# Patient Record
Sex: Female | Born: 1990 | Race: Black or African American | Hispanic: No | Marital: Single | Smoking: Never smoker
Health system: Southern US, Community
[De-identification: ages and names within clinical notes are randomized; demographics above are authoritative.]

## PROBLEM LIST (undated history)

## (undated) DIAGNOSIS — N390 Urinary tract infection, site not specified: Secondary | ICD-10-CM

## (undated) DIAGNOSIS — E119 Type 2 diabetes mellitus without complications: Secondary | ICD-10-CM

## (undated) DIAGNOSIS — R87619 Unspecified abnormal cytological findings in specimens from cervix uteri: Secondary | ICD-10-CM

## (undated) DIAGNOSIS — IMO0002 Reserved for concepts with insufficient information to code with codable children: Secondary | ICD-10-CM

## (undated) DIAGNOSIS — I10 Essential (primary) hypertension: Secondary | ICD-10-CM

## (undated) DIAGNOSIS — Z331 Pregnant state, incidental: Secondary | ICD-10-CM

## (undated) HISTORY — PX: NO PAST SURGERIES: SHX2092

---

## 2011-02-16 ENCOUNTER — Other Ambulatory Visit (HOSPITAL_COMMUNITY): Payer: Self-pay | Admitting: Obstetrics and Gynecology

## 2011-02-16 DIAGNOSIS — O3680X0 Pregnancy with inconclusive fetal viability, not applicable or unspecified: Secondary | ICD-10-CM

## 2011-02-17 ENCOUNTER — Ambulatory Visit (HOSPITAL_COMMUNITY)
Admission: RE | Admit: 2011-02-17 | Discharge: 2011-02-17 | Disposition: A | Discharge status: Home / Self Care | Payer: Medicaid Other | Source: Ambulatory Visit | Attending: Obstetrics and Gynecology | Admitting: Obstetrics and Gynecology

## 2011-02-17 DIAGNOSIS — O3680X0 Pregnancy with inconclusive fetal viability, not applicable or unspecified: Secondary | ICD-10-CM

## 2011-02-17 DIAGNOSIS — Z3689 Encounter for other specified antenatal screening: Secondary | ICD-10-CM | POA: Insufficient documentation

## 2011-02-28 ENCOUNTER — Emergency Department (HOSPITAL_COMMUNITY)
Admission: EM | Admit: 2011-02-28 | Discharge: 2011-02-28 | Disposition: A | Discharge status: Home / Self Care | Payer: Medicaid Other | Attending: Emergency Medicine | Admitting: Emergency Medicine

## 2011-02-28 DIAGNOSIS — F410 Panic disorder [episodic paroxysmal anxiety] without agoraphobia: Secondary | ICD-10-CM | POA: Insufficient documentation

## 2011-02-28 DIAGNOSIS — O99891 Other specified diseases and conditions complicating pregnancy: Secondary | ICD-10-CM | POA: Insufficient documentation

## 2011-02-28 DIAGNOSIS — J45909 Unspecified asthma, uncomplicated: Secondary | ICD-10-CM | POA: Insufficient documentation

## 2011-03-11 LAB — RPR: RPR: NONREACTIVE

## 2011-03-11 LAB — HEPATITIS B SURFACE ANTIGEN: Hepatitis B Surface Ag: NEGATIVE

## 2011-03-11 LAB — RUBELLA ANTIBODY, IGM: Rubella: IMMUNE

## 2011-03-26 ENCOUNTER — Emergency Department (HOSPITAL_COMMUNITY)
Admission: EM | Admit: 2011-03-26 | Discharge: 2011-03-26 | Disposition: A | Discharge status: Home / Self Care | Payer: Medicaid Other | Attending: Emergency Medicine | Admitting: Emergency Medicine

## 2011-03-26 DIAGNOSIS — O99891 Other specified diseases and conditions complicating pregnancy: Secondary | ICD-10-CM | POA: Insufficient documentation

## 2011-03-26 DIAGNOSIS — Z79899 Other long term (current) drug therapy: Secondary | ICD-10-CM | POA: Insufficient documentation

## 2011-03-26 DIAGNOSIS — N949 Unspecified condition associated with female genital organs and menstrual cycle: Secondary | ICD-10-CM | POA: Insufficient documentation

## 2011-03-26 DIAGNOSIS — O209 Hemorrhage in early pregnancy, unspecified: Secondary | ICD-10-CM | POA: Insufficient documentation

## 2011-03-26 LAB — COMPREHENSIVE METABOLIC PANEL
AST: 21 U/L (ref 0–37)
Albumin: 4 g/dL (ref 3.5–5.2)
Alkaline Phosphatase: 81 U/L (ref 39–117)
Calcium: 9.9 mg/dL (ref 8.4–10.5)
Chloride: 101 mEq/L (ref 96–112)
Creatinine, Ser: 0.66 mg/dL (ref 0.50–1.10)
GFR calc Af Amer: >60 mL/min (ref >60)
GFR calc non Af Amer: >60 mL/min (ref >60)
Sodium: 136 mEq/L (ref 135–145)
Total Bilirubin: 0.2 mg/dL — ABNORMAL LOW (ref 0.3–1.2)

## 2011-03-26 LAB — DIFFERENTIAL
Basophils Absolute: 0 10*3/uL (ref 0.0–0.1)
Basophils Relative: 0 % (ref 0–1)
Eosinophils Relative: 3 % (ref 0–5)
Lymphocytes Relative: 20 % (ref 12–46)
Neutro Abs: 6.4 10*3/uL (ref 1.7–7.7)

## 2011-03-26 LAB — CBC
HCT: 36.3 % (ref 36.0–46.0)
MCHC: 34.7 g/dL (ref 30.0–36.0)
Platelets: 283 10*3/uL (ref 150–400)
RDW: 12.9 % (ref 11.5–15.5)
WBC: 9 10*3/uL (ref 4.0–10.5)

## 2011-03-26 LAB — ABO/RH: ABO/RH(D): O POS

## 2011-03-26 LAB — HCG, QUANTITATIVE, PREGNANCY: hCG, Beta Chain, Quant, S: 16807 m[IU]/mL — ABNORMAL HIGH (ref <5)

## 2011-04-23 ENCOUNTER — Encounter: Payer: Self-pay | Admitting: *Deleted

## 2011-04-23 ENCOUNTER — Emergency Department (HOSPITAL_COMMUNITY)
Admission: EM | Admit: 2011-04-23 | Discharge: 2011-04-23 | Disposition: A | Discharge status: Home / Self Care | Payer: Medicaid Other | Attending: Emergency Medicine | Admitting: Emergency Medicine

## 2011-04-23 DIAGNOSIS — O208 Other hemorrhage in early pregnancy: Secondary | ICD-10-CM | POA: Insufficient documentation

## 2011-04-23 DIAGNOSIS — O4690 Antepartum hemorrhage, unspecified, unspecified trimester: Secondary | ICD-10-CM

## 2011-04-23 NOTE — ED Notes (Signed)
MD at bedside. 

## 2011-04-23 NOTE — ED Notes (Addendum)
Pt states she is [redacted] weeks pregnant and with vag. Bleeding that started today; last office visit end of June and reported prenancy without problems, U/S scheduled for 8/9; states that this is her first pregnancy; sees doctors with [facility]; third time with vaginal bleeding per pt

## 2011-04-23 NOTE — ED Notes (Signed)
Went in pt's room, pt not in room, purse on bed

## 2011-04-23 NOTE — ED Notes (Signed)
C/o vaginal bleeding onset 2000 tonight; states blood in toilet x 1 then blood on tissue paper x 1; pt is not wearing a pad; c/o lower abd throbbing approx 2000; pt [redacted] wks pregnant
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
## 2011-05-07 ENCOUNTER — Other Ambulatory Visit (HOSPITAL_COMMUNITY): Payer: Self-pay | Admitting: Obstetrics and Gynecology

## 2011-05-07 DIAGNOSIS — Z3689 Encounter for other specified antenatal screening: Secondary | ICD-10-CM

## 2011-05-13 ENCOUNTER — Ambulatory Visit (HOSPITAL_COMMUNITY)
Admission: RE | Admit: 2011-05-13 | Discharge: 2011-05-13 | Disposition: A | Discharge status: Home / Self Care | Payer: Medicaid Other | Source: Ambulatory Visit | Attending: Obstetrics and Gynecology | Admitting: Obstetrics and Gynecology

## 2011-05-13 DIAGNOSIS — Z1389 Encounter for screening for other disorder: Secondary | ICD-10-CM | POA: Insufficient documentation

## 2011-05-13 DIAGNOSIS — O358XX0 Maternal care for other (suspected) fetal abnormality and damage, not applicable or unspecified: Secondary | ICD-10-CM | POA: Insufficient documentation

## 2011-05-13 DIAGNOSIS — Z363 Encounter for antenatal screening for malformations: Secondary | ICD-10-CM | POA: Insufficient documentation

## 2011-05-13 DIAGNOSIS — Z3689 Encounter for other specified antenatal screening: Secondary | ICD-10-CM

## 2011-05-16 ENCOUNTER — Inpatient Hospital Stay (HOSPITAL_COMMUNITY)
Admission: AD | Admit: 2011-05-16 | Discharge: 2011-05-16 | Disposition: A | Discharge status: Home / Self Care | Payer: Medicaid Other | Source: Ambulatory Visit | Attending: Obstetrics and Gynecology | Admitting: Obstetrics and Gynecology

## 2011-05-16 ENCOUNTER — Encounter (HOSPITAL_COMMUNITY): Payer: Self-pay | Admitting: *Deleted

## 2011-05-16 ENCOUNTER — Emergency Department (HOSPITAL_COMMUNITY)
Admission: EM | Admit: 2011-05-16 | Discharge: 2011-05-16 | Disposition: A | Discharge status: Other Acute Inpatient Hospital | Payer: Medicaid Other | Source: Home / Self Care | Attending: Emergency Medicine | Admitting: Emergency Medicine

## 2011-05-16 DIAGNOSIS — Z331 Pregnant state, incidental: Secondary | ICD-10-CM

## 2011-05-16 DIAGNOSIS — T07XXXA Unspecified multiple injuries, initial encounter: Secondary | ICD-10-CM | POA: Insufficient documentation

## 2011-05-16 DIAGNOSIS — O99891 Other specified diseases and conditions complicating pregnancy: Secondary | ICD-10-CM | POA: Insufficient documentation

## 2011-05-16 DIAGNOSIS — R109 Unspecified abdominal pain: Secondary | ICD-10-CM | POA: Insufficient documentation

## 2011-05-16 LAB — URINALYSIS, ROUTINE W REFLEX MICROSCOPIC
Bilirubin Urine: NEGATIVE
Hgb urine dipstick: NEGATIVE
Ketones, ur: 15 mg/dL — AB
Nitrite: NEGATIVE
Protein, ur: NEGATIVE mg/dL
Specific Gravity, Urine: >1.03 — ABNORMAL HIGH (ref 1.005–1.030)
Urobilinogen, UA: 0.2 mg/dL (ref 0.0–1.0)

## 2011-05-16 MED ORDER — OXYTOCIN 20 UNITS IN LACTATED RINGERS INFUSION - SIMPLE
125.0000 mL/h | INTRAVENOUS | Status: DC
Start: 2011-05-16 — End: 2011-05-16

## 2011-05-16 MED ORDER — LIDOCAINE HCL (PF) 1 % IJ SOLN: 30.0000 mL | INTRAMUSCULAR | Status: DC | PRN

## 2011-05-16 MED ORDER — NALBUPHINE SYRINGE 5 MG/0.5 ML: 5.0000 mg | INJECTION | INTRAMUSCULAR | Status: DC | PRN

## 2011-05-16 MED ORDER — ACETAMINOPHEN 325 MG PO TABS: 650.0000 mg | ORAL_TABLET | ORAL | Status: DC | PRN

## 2011-05-16 MED ORDER — OXYTOCIN BOLUS FROM INFUSION: 500.0000 mL | Freq: Once | INTRAVENOUS | Status: DC

## 2011-05-16 MED ORDER — SODIUM CHLORIDE 0.9 % IV SOLN: Freq: Once | INTRAVENOUS | Status: DC

## 2011-05-16 MED ORDER — LACTATED RINGERS IV SOLN: 500.0000 mL | INTRAVENOUS | Status: DC | PRN

## 2011-05-16 MED ORDER — CLINDAMYCIN PHOSPHATE 900 MG/50ML IV SOLN: 900.0000 mg | Freq: Three times a day (TID) | INTRAVENOUS | Status: DC

## 2011-05-16 MED ORDER — LACTATED RINGERS IV SOLN: INTRAVENOUS | Status: DC

## 2011-05-16 MED ORDER — IBUPROFEN 600 MG PO TABS: 600.0000 mg | ORAL_TABLET | Freq: Four times a day (QID) | ORAL | Status: DC | PRN

## 2011-05-16 MED ORDER — FLEET ENEMA 7-19 GM/118ML RE ENEM: 1.0000 | ENEMA | RECTAL | Status: DC | PRN

## 2011-05-16 MED ORDER — CITRIC ACID-SODIUM CITRATE 334-500 MG/5ML PO SOLN: 30.0000 mL | ORAL | Status: DC | PRN

## 2011-05-16 MED ORDER — OXYCODONE-ACETAMINOPHEN 5-325 MG PO TABS: 2.0000 | ORAL_TABLET | ORAL | Status: DC | PRN

## 2011-05-16 MED ORDER — ONDANSETRON HCL 4 MG/2ML IJ SOLN: 4.0000 mg | Freq: Four times a day (QID) | INTRAMUSCULAR | Status: DC | PRN

## 2011-05-16 NOTE — Discharge Instructions (Signed)
Assault, Physical Assault includes any behavior, whether intentional or reckless, which results in bodily injury to another person and/or damage to property. Included in this would be any behavior, intentional or reckless, that by its nature would be understood (interpreted) by a reasonable person as intent to harm another person or to damage his/her property. Threats may be oral or written. They may be communicated through regular mail, computer, fax, or phone. These threats may be direct or implied. FORMS OF ASSAULT INCLUDE  Physically assaulting a person. This includes physical threats to inflict physical harm as well as:   Slapping.  Hitting.   Poking.  Kicking.   Punching.  Pushing.    Arson.   Sabotage.   Equipment vandalism.   Damaging or destroying property.  Throwing or hitting objects.    Displaying a weapon or an object that appears to be a weapon in a threatening manner.   Carrying a firearm of any kind.   Using a weapon to harm someone.   Using greater physical size/strength to intimidate another.   Making intimidating or threatening gestures.   Bullying.   Hazing.   Intimidating, threatening, hostile, or abusive language directed toward another person.   It communicates the intention to engage in violence against that person. And it leads a reasonable person to expect that violent behavior may occur.   Stalking another person.  IF IT HAPPENS AGAIN  Should this occur again, immediately call for emergency help (911 in [place]   If someone poses clear and immediate danger to you, seek legal authorities to have a protective or restraining order put in place.   Less threatening assaults can at least be reported to authorities.  STEPS TO TAKE IF A SEXUAL ASSAULT HAS HAPPENED  Go to an area of safety. This may include a shelter or staying with a friend. Stay away from the area where you have been attacked. A large percentage of sexual assaults are caused by  a friend, relative or associate.   If medications were given by your caregiver, take them as directed for the full length of time prescribed.   Only take over-the-counter or prescription medicines for pain, discomfort, or fever as directed by your caregiver.   If you have come in contact with a sexual disease, find out if you are to be tested again. If your caregiver is concerned about the HIV/AIDS virus, he/she may require you to have continued testing for several months.   For the protection of your privacy, test results can not be given over the phone. Make sure you receive the results of your test. If your test results are not back during your visit, make an appointment with your caregiver to find out the results. Do not assume everything is normal if you have not heard from your caregiver or the medical facility. It is important for you to follow up on all of your test results.   File appropriate papers with authorities. This is important in all assaults, even if it has occurred in a family or by a friend.  SEEK MEDICAL CARE IF  You have new problems because of your injuries.   You have problems that may be because of the medicine you are taking, such as:   Rash.  Itching.   Swelling.  Trouble breathing.    You develop belly (abdominal) pain, feel sick to your stomach (nausea) or are vomiting.   You begin to run a temperature.   You need supportive care or referral  to a rape crisis center. These are centers with trained personnel who can help you get through this ordeal.  SEEK IMMEDIATE MEDICAL CARE IF  You are afraid of being threatened, beaten, or abused. In [place] call 911.   You receive new injuries related to abuse.   You develop severe pain in any area injured in the assault or have any change in your condition that concerns you.   You faint or lose consciousness.   You develop chest pain or shortness of breath.  Document Released: 09/14/2005 Document Re-Released:  06/23/2008 [facility] Patient Information 2011 [place], [place].

## 2011-05-16 NOTE — Progress Notes (Signed)
Pt just up to BR and voided. Blanket to pt when back to bed

## 2011-05-16 NOTE — ED Notes (Signed)
0630 NS 200 TC when arrived from [facility] infused. Fld bag and line d/ced and saline lock left intact. Site clean and dry
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
## 2011-05-16 NOTE — ED Notes (Signed)
Pt transported via [employer]

## 2011-05-16 NOTE — Progress Notes (Signed)
Called RN to request monitor to be adjusted due to FHR deceleration.
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
## 2011-05-16 NOTE — Progress Notes (Signed)
From 0612 to 0636 maternal FHR was recording. Pt had sat up in bed.Trans adj.
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
## 2011-05-16 NOTE — ED Notes (Signed)
IV intact R hand with NS running with 200cc TC.
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
## 2011-05-16 NOTE — ED Notes (Signed)
PB and crackers with soda to pt

## 2011-05-16 NOTE — ED Notes (Signed)
Pt remains on QS monitor, spoke to [place], Charity fundraiser at [facility] who reports she spoke to Dr Jackelyn Knife and he wants pt to be transferred to [facility] for further monitoring.
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
## 2011-05-16 NOTE — Progress Notes (Signed)
S. SHore CNM in to see pt. EFm reviewed
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
## 2011-05-18 NOTE — Progress Notes (Signed)
I have reviewed FHT from 8-18.  It is reassuring for 25 weeks, no significant ctx.

## 2011-06-10 ENCOUNTER — Emergency Department (HOSPITAL_COMMUNITY)
Admission: EM | Admit: 2011-06-10 | Discharge: 2011-06-11 | Disposition: A | Discharge status: Home / Self Care | Payer: Medicaid Other | Attending: Emergency Medicine | Admitting: Emergency Medicine

## 2011-06-10 ENCOUNTER — Other Ambulatory Visit: Payer: Self-pay

## 2011-06-10 ENCOUNTER — Encounter (HOSPITAL_COMMUNITY): Payer: Self-pay

## 2011-06-10 DIAGNOSIS — R55 Syncope and collapse: Secondary | ICD-10-CM | POA: Insufficient documentation

## 2011-06-10 DIAGNOSIS — I498 Other specified cardiac arrhythmias: Secondary | ICD-10-CM | POA: Insufficient documentation

## 2011-06-10 DIAGNOSIS — Z331 Pregnant state, incidental: Secondary | ICD-10-CM | POA: Insufficient documentation

## 2011-06-10 HISTORY — DX: Pregnant state, incidental: Z33.1

## 2011-06-10 LAB — URINALYSIS, ROUTINE W REFLEX MICROSCOPIC
Glucose, UA: NEGATIVE mg/dL
Hgb urine dipstick: NEGATIVE
Leukocytes, UA: NEGATIVE
Protein, ur: NEGATIVE mg/dL
Specific Gravity, Urine: 1.015 (ref 1.005–1.030)
pH: 6 (ref 5.0–8.0)

## 2011-06-10 LAB — DIFFERENTIAL
Basophils Absolute: 0 10*3/uL (ref 0.0–0.1)
Basophils Relative: 0 % (ref 0–1)
Eosinophils Absolute: 0.1 10*3/uL (ref 0.0–0.7)
Eosinophils Relative: 1 % (ref 0–5)
Lymphs Abs: 1.2 10*3/uL (ref 0.7–4.0)
Neutrophils Relative %: 83 % — ABNORMAL HIGH (ref 43–77)

## 2011-06-10 LAB — HEPATIC FUNCTION PANEL
AST: 15 U/L (ref 0–37)
Albumin: 3.1 g/dL — ABNORMAL LOW (ref 3.5–5.2)
Total Protein: 6.7 g/dL (ref 6.0–8.3)

## 2011-06-10 LAB — BASIC METABOLIC PANEL
Calcium: 9.6 mg/dL (ref 8.4–10.5)
GFR calc Af Amer: >60 mL/min (ref >60)
GFR calc non Af Amer: >60 mL/min (ref >60)
Glucose, Bld: 106 mg/dL — ABNORMAL HIGH (ref 70–99)
Potassium: 3.8 mEq/L (ref 3.5–5.1)
Sodium: 134 mEq/L — ABNORMAL LOW (ref 135–145)

## 2011-06-10 LAB — CBC
MCH: 27.8 pg (ref 26.0–34.0)
Platelets: 211 10*3/uL (ref 150–400)
RBC: 4.25 MIL/uL (ref 3.87–5.11)
RDW: 13 % (ref 11.5–15.5)
WBC: 10.3 10*3/uL (ref 4.0–10.5)

## 2011-06-10 MED ORDER — SODIUM CHLORIDE 0.9 % IV SOLN
Freq: Once | INTRAVENOUS | Status: AC
  Administered 2011-06-10: 22:00:00 via INTRAVENOUS
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
## 2011-06-10 NOTE — ED Notes (Signed)
Pt walked unassisted to bathroom, had steady gait, remains alert. resp even and unlabored, multiple family members at bedside.
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
## 2011-06-11 NOTE — ED Notes (Signed)
Pt left er stating no needs 

## 2011-07-22 ENCOUNTER — Encounter (HOSPITAL_COMMUNITY): Payer: Self-pay | Admitting: *Deleted

## 2011-07-22 ENCOUNTER — Emergency Department (HOSPITAL_COMMUNITY)
Admission: EM | Admit: 2011-07-22 | Discharge: 2011-07-22 | Disposition: A | Discharge status: Home / Self Care | Payer: Medicaid Other | Attending: Emergency Medicine | Admitting: Emergency Medicine

## 2011-07-22 DIAGNOSIS — R51 Headache: Secondary | ICD-10-CM

## 2011-07-22 DIAGNOSIS — Z331 Pregnant state, incidental: Secondary | ICD-10-CM | POA: Insufficient documentation

## 2011-07-22 DIAGNOSIS — J45909 Unspecified asthma, uncomplicated: Secondary | ICD-10-CM | POA: Insufficient documentation

## 2011-07-22 MED ORDER — MORPHINE SULFATE 4 MG/ML IJ SOLN
4.0000 mg | Freq: Once | INTRAMUSCULAR | Status: AC
  Administered 2011-07-22: 4 mg via INTRAMUSCULAR
  Filled 2011-07-22: qty 1

## 2011-07-22 NOTE — ED Provider Notes (Signed)
History     CSN: [id number] Arrival date & time: 07/22/2011  2:05 AM   First MD Initiated Contact with Patient 07/22/11 0235      Chief Complaint  Patient presents with  . Headache    (Consider location/radiation/quality/duration/timing/severity/associated sxs/prior treatment) HPI Comments: Patient is a 20 year old female who is approximately [redacted] weeks pregnant who presents with a complaint of headache. She states that this started approximately 2 and half hours prior to arrival, is a throbbing sensation, feels this pain across the front of her head and radiation to her neck. Her symptoms are constant, not made better with Tylenol and not associated with nausea vomiting diarrhea cough shortness of breath or abdominal pain. She does have some intermittent blurry vision with this that she noted when she was driving here tonight. She denies swelling of her feet. She admits to getting approximately 2 headaches per week but states that this headache is different in the way that it is located in the frontal area and because it has not gone away with Tylenol.  Patient is a 20 y.o. female presenting with headaches. The history is provided by the patient, a friend and medical records.  Headache     Past Medical History  Diagnosis Date  . Asthma   . Pregnant state, incidental     Past Surgical History  Procedure Date  . No past surgeries     No family history on file.  History  Substance Use Topics  . Smoking status: Never Smoker   . Smokeless tobacco: Not on file  . Alcohol Use: No    OB History    Grav Para Term Preterm Abortions TAB SAB Ect Mult Living   1               Review of Systems  Neurological: Positive for headaches.  All other systems reviewed and are negative.    Allergies  Review of patient's allergies indicates no known allergies.  Home Medications   Current Outpatient Rx  Name Route Sig Dispense Refill  . PRENATAL RX 60-1 MG PO TABS Oral Take 1  tablet by mouth daily.      Marland Kitchen DOCUSATE SODIUM 50 MG PO CAPS Oral Take by mouth 2 (two) times daily.      Marland Kitchen FEXOFENADINE HCL 180 MG PO TABS Oral Take 180 mg by mouth daily.        BP 162/93  Pulse 92  Temp(Src) 98.6 F (37 C) (Oral)  Resp 20  Ht 5\' 5"  (1.651 m)  Wt 270 lb (122.471 kg)  BMI 44.93 kg/m2  SpO2 97%  Physical Exam  Nursing note and vitals reviewed. Constitutional: She appears well-developed and well-nourished. No distress.  HENT:  Head: Normocephalic and atraumatic.  Mouth/Throat: Oropharynx is clear and moist. No oropharyngeal exudate.  Eyes: Conjunctivae and EOM are normal. Pupils are equal, round, and reactive to light. Right eye exhibits no discharge. Left eye exhibits no discharge. No scleral icterus.  Neck: Normal range of motion. Neck supple. No JVD present. No thyromegaly present.  Cardiovascular: Normal rate, regular rhythm, normal heart sounds and intact distal pulses.  Exam reveals no gallop and no friction rub.   No murmur heard. Pulmonary/Chest: Effort normal and breath sounds normal. No respiratory distress. She has no wheezes. She has no rales.  Abdominal: Soft. Bowel sounds are normal. She exhibits no distension and no mass. There is no tenderness.  Musculoskeletal: Normal range of motion. She exhibits no edema and no tenderness.  Lymphadenopathy:  She has no cervical adenopathy.  Neurological: She is alert. Coordination normal.  Skin: Skin is warm and dry. No rash noted. No erythema.  Psychiatric: She has a normal mood and affect. Her behavior is normal.    ED Course  Procedures (including critical care time)  Labs Reviewed - No data to display No results found.   No diagnosis found.    MDM  Physical exam is benign with soft but a gravid abdomen, clear lungs and heart, clear oropharynx, normal pupillary exam. She is hypertensive at 162/93. Will need visual acuity, pain medications, reevaluate blood pressure.    Visual acuity was 20/20,  patient has complete resolution of symptoms with pain medication including IV morphine. Recheck of her blood pressure is 112/68.  I have recommended that she followup with her OB/GYN this week for a recheck in her blood pressure.  Vida Roller, MD 07/22/11 [phone]

## 2011-07-22 NOTE — ED Notes (Signed)
Repeat B/P - 112/58 L arm. Dr Hyacinth Meeker at bedside.

## 2011-07-22 NOTE — ED Notes (Signed)
Pt reports "migraine" type headache starting approx 1 hr ago, pt reports she is [redacted] weeks pregnant, no abd pain or bleeding noted

## 2011-07-23 ENCOUNTER — Other Ambulatory Visit: Payer: Self-pay

## 2011-07-23 ENCOUNTER — Encounter (HOSPITAL_COMMUNITY): Payer: Self-pay | Admitting: *Deleted

## 2011-07-23 ENCOUNTER — Emergency Department (HOSPITAL_COMMUNITY): Payer: Medicaid Other

## 2011-07-23 ENCOUNTER — Emergency Department (HOSPITAL_COMMUNITY)
Admission: EM | Admit: 2011-07-23 | Discharge: 2011-07-23 | Disposition: A | Discharge status: Home / Self Care | Payer: Medicaid Other | Attending: Emergency Medicine | Admitting: Emergency Medicine

## 2011-07-23 DIAGNOSIS — R110 Nausea: Secondary | ICD-10-CM | POA: Insufficient documentation

## 2011-07-23 DIAGNOSIS — I951 Orthostatic hypotension: Secondary | ICD-10-CM

## 2011-07-23 DIAGNOSIS — R55 Syncope and collapse: Secondary | ICD-10-CM

## 2011-07-23 DIAGNOSIS — O99891 Other specified diseases and conditions complicating pregnancy: Secondary | ICD-10-CM | POA: Insufficient documentation

## 2011-07-23 LAB — DIFFERENTIAL
Basophils Absolute: 0 10*3/uL (ref 0.0–0.1)
Eosinophils Relative: 2 % (ref 0–5)
Lymphocytes Relative: 21 % (ref 12–46)
Lymphs Abs: 1.6 10*3/uL (ref 0.7–4.0)
Monocytes Absolute: 0.4 10*3/uL (ref 0.1–1.0)
Neutro Abs: 5.3 10*3/uL (ref 1.7–7.7)

## 2011-07-23 LAB — URINALYSIS, ROUTINE W REFLEX MICROSCOPIC
Bilirubin Urine: NEGATIVE
Hgb urine dipstick: NEGATIVE
Nitrite: NEGATIVE
Protein, ur: NEGATIVE mg/dL
Urobilinogen, UA: 0.2 mg/dL (ref 0.0–1.0)

## 2011-07-23 LAB — COMPREHENSIVE METABOLIC PANEL
ALT: 13 U/L (ref 0–35)
AST: 17 U/L (ref 0–37)
CO2: 22 mEq/L (ref 19–32)
Chloride: 104 mEq/L (ref 96–112)
Creatinine, Ser: 0.59 mg/dL (ref 0.50–1.10)
GFR calc Af Amer: >90 mL/min (ref >90)
GFR calc non Af Amer: >90 mL/min (ref >90)
Glucose, Bld: 96 mg/dL (ref 70–99)
Sodium: 135 mEq/L (ref 135–145)
Total Bilirubin: 0.1 mg/dL — ABNORMAL LOW (ref 0.3–1.2)

## 2011-07-23 LAB — CBC
HCT: 37.2 % (ref 36.0–46.0)
MCV: 80.9 fL (ref 78.0–100.0)
RBC: 4.6 MIL/uL (ref 3.87–5.11)
RDW: 13.6 % (ref 11.5–15.5)
WBC: 7.4 10*3/uL (ref 4.0–10.5)

## 2011-07-23 MED ORDER — OXYCODONE-ACETAMINOPHEN 5-325 MG PO TABS
ORAL_TABLET | ORAL | Status: AC
  Filled 2011-07-23: qty 1

## 2011-07-23 MED ORDER — ACETAMINOPHEN-CODEINE #3 300-30 MG PO TABS: 1.0000 | ORAL_TABLET | Freq: Four times a day (QID) | ORAL | Status: AC | PRN

## 2011-07-23 MED ORDER — SODIUM CHLORIDE 0.9 % IV BOLUS (SEPSIS)
1000.0000 mL | Freq: Once | INTRAVENOUS | Status: AC
  Administered 2011-07-23: 1000 mL via INTRAVENOUS

## 2011-07-23 MED ORDER — OXYCODONE-ACETAMINOPHEN 5-325 MG PO TABS
1.0000 | ORAL_TABLET | Freq: Once | ORAL | Status: AC
  Administered 2011-07-23: 1 via ORAL
  Filled 2011-07-23: qty 1

## 2011-07-23 NOTE — ED Notes (Signed)
Pt reports she woke up about 3:30, and when she got up she became dizzy and fell.

## 2011-07-23 NOTE — ED Notes (Signed)
Pt reports getting dizzy and "thinking I passed out", after getting up.  Pt reports falling on right side, and currently feeling some ache in right side.  Denies any abdominal pain, fluid, discharge or bleeding.  FHT 153, strong and regular.  Pt denies any dizziness at present time.  Does report mild nausea.  IV started, EKG completed.  Dr. Hyacinth Meeker at bedside.

## 2011-07-23 NOTE — ED Provider Notes (Signed)
History     CSN: [id number] Arrival date & time: 07/23/2011  4:21 AM   First MD Initiated Contact with Patient 07/23/11 [phone]      Chief Complaint  Patient presents with  . Near Syncope  . Nausea    (Consider location/radiation/quality/duration/timing/severity/associated sxs/prior treatment) HPI Comments: Vision is a 20 year old female with a history of pregnancy at [redacted] weeks gestation who presents with a syncopal episode. The patient was seen yesterday in the emergency department with a headache and was treated with morphine with significant improvement. She states that throughout the day she has been groggy and tired after getting the morphine but has been able to eat at least to meals and has been ambulatory. She states that she went to bed at 10:30 and awoke at 3:30 in the morning, ambulated to the kitchen to get a meal, back to the bedroom to eat, and then back to the kitchen. On her way back to the kitchen she remembers feeling lightheaded as she stood up and walked after which she had a syncopal episode which she does not remember. The next thing she does remember she was awake on the floor of the kitchen, and because she was essentially by herself she got herself into the car and drove herself to the hospital. She had no difficulty ambulating to the car were driving to the hospital. She admits that she still has a mild headache but has no changes in her vision, numbness, weakness, ataxia, speech problems. She denies chest pain, palpitations, shortness of breath, abdominal pain, diarrhea, dysuria, swelling or rashes. She states that she is followed by OB/GYN for her pregnancy and has no complications with this pregnancy. According to the medical record she was seen approximately one month ago for similar symptoms after having a syncopal event.  The history is provided by the patient and medical records.    Past Medical History  Diagnosis Date  . Asthma   . Pregnant state, incidental      Past Surgical History  Procedure Date  . No past surgeries     History reviewed. No pertinent family history.  History  Substance Use Topics  . Smoking status: Never Smoker   . Smokeless tobacco: Not on file  . Alcohol Use: No    OB History    Grav Para Term Preterm Abortions TAB SAB Ect Mult Living   1               Review of Systems  All other systems reviewed and are negative.    Allergies  Review of patient's allergies indicates no known allergies.  Home Medications   Current Outpatient Rx  Name Route Sig Dispense Refill  . ACETAMINOPHEN-CODEINE #3 300-30 MG PO TABS Oral Take 1-2 tablets by mouth every 6 (six) hours as needed for pain. 15 tablet 0  . DOCUSATE SODIUM 50 MG PO CAPS Oral Take by mouth 2 (two) times daily.      Marland Kitchen FEXOFENADINE HCL 180 MG PO TABS Oral Take 180 mg by mouth daily.      Marland Kitchen PRENATAL RX 60-1 MG PO TABS Oral Take 1 tablet by mouth daily.        BP 118/75  Pulse 88  Temp 98.3 F (36.8 C)  Resp 18  SpO2 97%  Physical Exam  Nursing note and vitals reviewed. Constitutional: She appears well-developed and well-nourished. No distress.  HENT:  Head: Normocephalic and atraumatic.  Mouth/Throat: Oropharynx is clear and moist. No oropharyngeal exudate.  Eyes:  Conjunctivae and EOM are normal. Pupils are equal, round, and reactive to light. Right eye exhibits no discharge. Left eye exhibits no discharge. No scleral icterus.  Neck: Normal range of motion. Neck supple. No JVD present. No thyromegaly present.  Cardiovascular: Normal rate, regular rhythm, normal heart sounds and intact distal pulses.  Exam reveals no gallop and no friction rub.   No murmur heard. Pulmonary/Chest: Effort normal and breath sounds normal. No respiratory distress. She has no wheezes. She has no rales.  Abdominal: Soft. Bowel sounds are normal. She exhibits no distension and no mass. There is no tenderness.       Gravid and nontender  Musculoskeletal: Normal range  of motion. She exhibits no edema and no tenderness.  Lymphadenopathy:    She has no cervical adenopathy.  Neurological: She is alert. Coordination normal.  Skin: Skin is warm and dry. No rash noted. No erythema.  Psychiatric: She has a normal mood and affect. Her behavior is normal.    ED Course  Procedures (including critical care time)  Diagnosis:  1. Syncope   2. Orthostatic hypotension       MDM  At this time the patient has a very normal exam including neurologic exam. She has normal vital signs, normal speech, moving all extremities, normal heart and lung exam, this gravid abdomen with normal fetal heart tones at 153. She has no apparent trauma from her fall including no head injuries or upper or lower extremity injuries or deformity. We'll check hemoglobin, urinalysis, electrolytes. Patient may need CT scan of her head to rule out other sources of headache precipitating syncope.  ED ECG REPORT   Date: 07/23/2011   Rate: 97  Rhythm: normal sinus rhythm  QRS Axis: normal  Intervals: normal  ST/T Wave abnormalities: nonspecific T wave changes  Conduction Disutrbances:none  Narrative Interpretation: Has no change from prior 06/10/11 - T waves still inverted inferior and anterior leads  Old EKG Reviewed: unchanged  Results for orders placed during the hospital encounter of 07/23/11  CBC      Component Value Range   WBC 7.4  4.0 - 10.5 (K/uL)   RBC 4.60  3.87 - 5.11 (MIL/uL)   Hemoglobin 12.7  12.0 - 15.0 (g/dL)   HCT 40.9  81.1 - 91.4 (%)   MCV 80.9  78.0 - 100.0 (fL)   MCH 27.6  26.0 - 34.0 (pg)   MCHC 34.1  30.0 - 36.0 (g/dL)   RDW 78.2  95.6 - 21.3 (%)   Platelets 171  150 - 400 (K/uL)  DIFFERENTIAL      Component Value Range   Neutrophils Relative 72  43 - 77 (%)   Neutro Abs 5.3  1.7 - 7.7 (K/uL)   Lymphocytes Relative 21  12 - 46 (%)   Lymphs Abs 1.6  0.7 - 4.0 (K/uL)   Monocytes Relative 5  3 - 12 (%)   Monocytes Absolute 0.4  0.1 - 1.0 (K/uL)    Eosinophils Relative 2  0 - 5 (%)   Eosinophils Absolute 0.1  0.0 - 0.7 (K/uL)   Basophils Relative 0  0 - 1 (%)   Basophils Absolute 0.0  0.0 - 0.1 (K/uL)  COMPREHENSIVE METABOLIC PANEL      Component Value Range   Sodium 135  135 - 145 (mEq/L)   Potassium 3.6  3.5 - 5.1 (mEq/L)   Chloride 104  96 - 112 (mEq/L)   CO2 22  19 - 32 (mEq/L)   Glucose, Bld  96  70 - 99 (mg/dL)   BUN 6  6 - 23 (mg/dL)   Creatinine, Ser 9.62  0.50 - 1.10 (mg/dL)   Calcium 95.2  8.4 - 10.5 (mg/dL)   Total Protein 6.7  6.0 - 8.3 (g/dL)   Albumin 2.9 (*) 3.5 - 5.2 (g/dL)   AST 17  0 - 37 (U/L)   ALT 13  0 - 35 (U/L)   Alkaline Phosphatase 173 (*) 39 - 117 (U/L)   Total Bilirubin 0.1 (*) 0.3 - 1.2 (mg/dL)   GFR calc non Af Amer >90  >90 (mL/min)   GFR calc Af Amer >90  >90 (mL/min)  URINALYSIS, ROUTINE W REFLEX MICROSCOPIC      Component Value Range   Color, Urine YELLOW  YELLOW    Appearance CLEAR  CLEAR    Specific Gravity, Urine 1.020  1.005 - 1.030    pH 6.5  5.0 - 8.0    Glucose, UA NEGATIVE  NEGATIVE (mg/dL)   Hgb urine dipstick NEGATIVE  NEGATIVE    Bilirubin Urine NEGATIVE  NEGATIVE    Ketones, ur NEGATIVE  NEGATIVE (mg/dL)   Protein, ur NEGATIVE  NEGATIVE (mg/dL)   Urobilinogen, UA 0.2  0.0 - 1.0 (mg/dL)   Nitrite NEGATIVE  NEGATIVE    Leukocytes, UA NEGATIVE  NEGATIVE    Ct Head Wo Contrast  07/23/2011  *RADIOLOGY REPORT*  Clinical Data: Near-syncope; nausea.  [redacted] weeks pregnant.  CT HEAD WITHOUT CONTRAST  Technique:  Contiguous axial images were obtained from the base of the skull through the vertex without contrast.  Comparison: None.  Findings: There is no evidence of acute infarction, mass lesion, or intra- or extra-axial hemorrhage on CT.  The posterior fossa, including the cerebellum, brainstem and fourth ventricle, is within normal limits.  The third and lateral ventricles, and basal ganglia are unremarkable in appearance.  The cerebral hemispheres are symmetric in appearance, with normal  gray- white differentiation.  No mass effect or midline shift is seen.  There is no evidence of fracture; visualized osseous structures are unremarkable in appearance.  The visualized portions of the orbits are within normal limits.  The paranasal sinuses and mastoid air cells are well-aerated.  No significant soft tissue abnormalities are seen.  IMPRESSION: Unremarkable noncontrast CT of the head.  Original Report Authenticated By: Tonia Ghent, M.D.   Findings of the CT scan in the blood and urine tests reviewed with patient. She will followup today at her appointment. I suspect that she had orthostatic hypotension given the change in her vital signs when she stands. She was given IV fluid resuscitation on arrival.   Vida Roller, MD 07/23/11 [phone]

## 2011-08-09 ENCOUNTER — Inpatient Hospital Stay (HOSPITAL_COMMUNITY)
Admission: AD | Admit: 2011-08-09 | Discharge: 2011-08-09 | Disposition: A | Discharge status: Home / Self Care | Payer: Medicaid Other | Attending: Obstetrics and Gynecology | Admitting: Obstetrics and Gynecology

## 2011-08-09 ENCOUNTER — Encounter (HOSPITAL_COMMUNITY): Payer: Self-pay | Admitting: Emergency Medicine

## 2011-08-09 DIAGNOSIS — R109 Unspecified abdominal pain: Secondary | ICD-10-CM

## 2011-08-09 DIAGNOSIS — O479 False labor, unspecified: Secondary | ICD-10-CM | POA: Insufficient documentation

## 2011-08-09 DIAGNOSIS — O26899 Other specified pregnancy related conditions, unspecified trimester: Secondary | ICD-10-CM

## 2011-08-09 NOTE — Progress Notes (Signed)
Saline lock dc'd as pt being discharged home.

## 2011-08-09 NOTE — ED Notes (Signed)
Pt place don TOCO monitor upon arrival. Pt is laughing and talking with S.O. NAD. PT states last pain to lower back was at 1740.

## 2011-08-09 NOTE — Progress Notes (Signed)
Dr. Senaida Ores notified of pt presenting from [facility] for labor check there.  MD was aware of pt coming via carelink.  Notified of VE and reactive fetal strip. Notified of pt having appointment tomorrow.  Orders to dc home.

## 2011-08-09 NOTE — ED Notes (Signed)
K.Mecca Guitron,RNC-OB/RROB spokw with Dr Lynelle Doctor and told of pt with no contractions, fhr reactive, offered to send RROB over who is coming on shift to check pt's cervix and assess pt to avoid pt having to be transferred to another facility if not laboring.  Dr did not wish to do this, would rather pt to be transferred to [facility] to be assessed.

## 2011-08-09 NOTE — ED Notes (Addendum)
Pt [redacted] weeks pregnant. C/o abd cramping and back pain x 1.5 hours. Denies any vaginal bleeding or d/c.

## 2011-08-09 NOTE — ED Provider Notes (Signed)
History     CSN: [id number] Arrival date & time: 08/09/2011  5:56 PM   First MD Initiated Contact with Patient 08/09/11 1756      Chief Complaint  Patient presents with  . Back Pain  . Labor Eval    (Consider location/radiation/quality/duration/timing/severity/associated sxs/prior treatment) HPI Patient presents to the emergency room with complaints of contractions. She is [redacted] weeks pregnant and has abdominal and back pain for the last 1.5 hours. She denies any vaginal bleeding or discharge. Patient thinks the contractions are coming every 5-7 minutes. Patient is G1 P0. The contractions are moderate. She has no other complaints. Past Medical History  Diagnosis Date  . Asthma   . Pregnant state, incidental     Past Surgical History  Procedure Date  . No past surgeries     History reviewed. No pertinent family history.  History  Substance Use Topics  . Smoking status: Never Smoker   . Smokeless tobacco: Not on file  . Alcohol Use: No    OB History    Grav Para Term Preterm Abortions TAB SAB Ect Mult Living   1               Review of Systems  All other systems reviewed and are negative.    Allergies  Review of patient's allergies indicates no known allergies.  Home Medications   Current Outpatient Rx  Name Route Sig Dispense Refill  . DOCUSATE SODIUM 50 MG PO CAPS Oral Take by mouth 2 (two) times daily.      Marland Kitchen FEXOFENADINE HCL 180 MG PO TABS Oral Take 180 mg by mouth daily.      Marland Kitchen PRENATAL RX 60-1 MG PO TABS Oral Take 1 tablet by mouth daily.        BP 133/96  Pulse 101  Temp 98.5 F (36.9 C)  Resp 20  Ht 5\' 5"  (1.651 m)  Wt 269 lb (122.018 kg)  BMI 44.76 kg/m2  Physical Exam  Nursing note and vitals reviewed. Constitutional: She appears well-developed and well-nourished. No distress.  HENT:  Head: Normocephalic and atraumatic.  Right Ear: External ear normal.  Left Ear: External ear normal.  Eyes: Conjunctivae are normal. Right eye exhibits  no discharge. Left eye exhibits no discharge. No scleral icterus.  Neck: Neck supple. No tracheal deviation present.  Cardiovascular: Normal rate, regular rhythm and intact distal pulses.   Pulmonary/Chest: Effort normal and breath sounds normal. No stridor. No respiratory distress. She has no wheezes. She has no rales.  Abdominal: Soft. Bowel sounds are normal. She exhibits mass. She exhibits no distension. There is no tenderness. There is no rebound and no guarding.       Gravid abdomen, nontender  Genitourinary:       Cervix approximately 1 cm dilated soft, occiput presentation,   Musculoskeletal: She exhibits no edema and no tenderness.  Neurological: She is alert. She has normal strength. No sensory deficit. Cranial nerve deficit:  no gross defecits noted. She exhibits normal muscle tone. She displays no seizure activity. Coordination normal.  Skin: Skin is warm and dry. No rash noted.  Psychiatric: She has a normal mood and affect.    ED Course  Procedures (including critical care time) Patient was placed on fetal heart monitor. [facility] was contacted to arrange for transport. I consulted with Dr. Senaida Ores. The patient be transferred to the MAU for evaluation.  Medications - No data to display  Labs Reviewed - No data to display No results found.  MDM  Patient presents in late third trimester with complaints of contractions. At this time she is hemodynamically stable. She does not appear to be in immediate labor. She'll be transferred to [facility] hospital for further evaluation        Celene Kras, MD 08/09/11 1843

## 2011-08-09 NOTE — Progress Notes (Signed)
Pt arrived via carelink from [facility].  Presented there for back labor.  Pt stated that after being there her contractions "stopped at 520". Pt denies pain at this time.

## 2011-08-18 ENCOUNTER — Inpatient Hospital Stay (EMERGENCY_DEPARTMENT_HOSPITAL)
Admission: EM | Admit: 2011-08-18 | Discharge: 2011-08-18 | Disposition: A | Discharge status: Home / Self Care | Payer: Medicaid Other | Source: Ambulatory Visit | Admitting: Obstetrics and Gynecology

## 2011-08-18 ENCOUNTER — Encounter (HOSPITAL_COMMUNITY): Payer: Self-pay

## 2011-08-18 DIAGNOSIS — O479 False labor, unspecified: Secondary | ICD-10-CM | POA: Insufficient documentation

## 2011-08-18 HISTORY — DX: Reserved for concepts with insufficient information to code with codable children: IMO0002

## 2011-08-18 HISTORY — DX: Unspecified abnormal cytological findings in specimens from cervix uteri: R87.619

## 2011-08-18 HISTORY — DX: Urinary tract infection, site not specified: N39.0

## 2011-08-18 NOTE — ED Provider Notes (Signed)
History     CSN: [id number] Arrival date & time: 08/18/2011  2:11 PM   First MD Initiated Contact with Patient 08/18/11 1419      Chief Complaint  Patient presents with  . Contractions    (Consider location/radiation/quality/duration/timing/severity/associated sxs/prior treatment) The history is provided by the patient.  She is 38.[redacted] weeks pregnant with EDC of November 29. One hour ago, in the shower, she noted onset of contractions and when she was drying herself after the shower, she noted some blood on the towel. Contractions have been coming about every 7-10 minutes. She is G1, P0, A0. She denies any complications of the current pregnancy other than episodes of syncope. She has had some slight peripheral edema.  Past Medical History  Diagnosis Date  . Asthma   . Pregnant state, incidental     Past Surgical History  Procedure Date  . No past surgeries     No family history on file.  History  Substance Use Topics  . Smoking status: Never Smoker   . Smokeless tobacco: Not on file  . Alcohol Use: No    OB History    Grav Para Term Preterm Abortions TAB SAB Ect Mult Living   1               Review of Systems  All other systems reviewed and are negative.    Allergies  Review of patient's allergies indicates no known allergies.  Home Medications   Current Outpatient Rx  Name Route Sig Dispense Refill  . DOCUSATE SODIUM 50 MG PO CAPS Oral Take by mouth 2 (two) times daily.      Marland Kitchen FEXOFENADINE HCL 180 MG PO TABS Oral Take 180 mg by mouth daily.      Marland Kitchen PRENATAL RX 60-1 MG PO TABS Oral Take 1 tablet by mouth daily.        BP 128/86  Pulse 101  Temp(Src) 98.2 F (36.8 C) (Oral)  Resp 20  Ht 5\' 5"  (1.651 m)  Wt 259 lb (117.482 kg)  BMI 43.10 kg/m2  SpO2 98%  Physical Exam  Nursing note and vitals reviewed.  20 year old female in no acute distress. Vital signs are normal. She is morbidly obese. Head is normocephalic and atraumatic. PERRLA, EOMI. Neck is  supple without adenopathy. Back is nontender. Lungs are clear without rales, wheezes, rhonchi. Heart has regular rate and rhythm with a faint 1-2/6 systolic ejection murmur present. Abdomen is soft and nontender. Fundus is gravid and almost to the xiphoid. Manual pelvic exam shows cervix is closed and perhaps 25% effaced. No blood is seen. Extremities have 1+ pitting edema. Neurologic: Mental status is normal, cranial nerves are intact, there are no focal motor or sensory deficits. Psychiatric: No abnormalities of mood or affect.  ED Course  Procedures (including critical care time)  Labs Reviewed - No data to display No results found.   No diagnosis found.  Case has been discussed with Dr. Tracey Harries who agrees to accept the patient in transfer. She will be sent to [facility] in [place] for fetal monitoring and observation.  MDM  Prior ED visit reviewed. She was in the ED 1 week ago with contractions and was transferred to [facility] hospital for observation.        Dione Booze, MD 08/18/11 [phone]

## 2011-08-18 NOTE — ED Notes (Signed)
Dr. Preston Fleeting reports cervix is not open at this time.

## 2011-08-18 NOTE — ED Notes (Signed)
Dr. Preston Fleeting checking pt's cervix

## 2011-08-18 NOTE — ED Notes (Signed)
Placed pt on fetal monitor.  Notified Counselling psychologist at [employer] and she is monitoring pt.

## 2011-08-18 NOTE — ED Notes (Signed)
Pt reports was in shower this afternoon around 1300 and passed some bloody, mucous.  Reports shortly after started having contractions.  Says contractions have been 7-9min apart.

## 2011-08-18 NOTE — ED Provider Notes (Signed)
Chief Complaint:  Contractions   Marie Price is  20 y.o. G1P0.  No LMP recorded. Patient is pregnant.. [redacted]w[redacted]d  She presents complaining of Contractions  Onset is described as sudden and has been present for  Several  hours. Pt was transferred from [facility] ED for further evaluation of c/o bleeding and ctx. Pt reports sudden onset of BRB this morning while in the shower, shortly after ctx began q 7-10 mins. Pt was evaluated in ED at [facility] and had negative exam. Presents now as transfer reports irregular ctx ~ q 10 mins. Report good FM. Denies vaginal bleeding at present.  Obstetrical/Gynecological History: OB History    Grav Para Term Preterm Abortions TAB SAB Ect Mult Living   1               Past Medical History: Past Medical History  Diagnosis Date  . Asthma   . Pregnant state, incidental   . Urinary tract infection   . Abnormal Pap smear     Past Surgical History: Past Surgical History  Procedure Date  . No past surgeries     Family History: Family History  Problem Relation Age of Onset  . Anesthesia problems Neg Hx     Social History: History  Substance Use Topics  . Smoking status: Never Smoker   . Smokeless tobacco: Never Used  . Alcohol Use: No    Allergies: No Known Allergies  Review of Systems - Negative except what has been reviewed in HPI  Physical Exam   Blood pressure 147/96, pulse 99, temperature 98.3 F (36.8 C), temperature source Oral, resp. rate 20, height 5\' 5"  (1.651 m), weight 117.482 kg (259 lb), SpO2 99.00%.  General: General appearance - alert, well appearing, and in no distress, oriented to person, place, and time and overweight Mental status - alert, oriented to person, place, and time, normal mood, behavior, speech, dress, motor activity, and thought processes, affect appropriate to mood Abdomen - Gravid, nontender Neurological - alert, oriented, normal speech, no focal findings or movement disorder noted, DTR's normal and  symmetric Extremities - pedal edema 1 + Focused Gynecological Exam: VULVA: normal appearing vulva with no masses, tenderness or lesions, VAGINA: normal appearing vagina with normal color and discharge, no lesions, no bleeding noted in vault, scant while discharge, CERVIX: normal appearing cervix without discharge or lesions, loose 1cm/60/vtx/-2, no blding or fluid return noted on glove with bimanual exam, UTERUS: enlarged to 40 week's size  MD Consult: Discussed with Dr. Ambrose Mantle   Assessment: False Labor  Plan: Discharge home Kick counts/labor precautions FU as scheduled in office tomorrow  [name] E. 08/18/2011,6:05 PM

## 2011-08-18 NOTE — Progress Notes (Signed)
Was taking a shower, had gush of blood, and started contracting - were every 7-79min, now aches in privates.

## 2011-08-18 NOTE — ED Notes (Signed)
FHR 165.  Spoke with Joy, rapid response nurse at [facility] and was informed she could see the reading and she will chart on baby.

## 2011-08-21 ENCOUNTER — Inpatient Hospital Stay (HOSPITAL_COMMUNITY)
Admission: AD | Admit: 2011-08-21 | Discharge: 2011-08-25 | DRG: 766 | Disposition: A | Discharge status: Home / Self Care | Payer: Medicaid Other | Source: Ambulatory Visit | Attending: Obstetrics and Gynecology | Admitting: Obstetrics and Gynecology

## 2011-08-21 DIAGNOSIS — O139 Gestational [pregnancy-induced] hypertension without significant proteinuria, unspecified trimester: Principal | ICD-10-CM | POA: Diagnosis present

## 2011-08-21 DIAGNOSIS — Z3490 Encounter for supervision of normal pregnancy, unspecified, unspecified trimester: Secondary | ICD-10-CM

## 2011-08-21 LAB — COMPREHENSIVE METABOLIC PANEL
ALT: 26 U/L (ref 0–35)
AST: 23 U/L (ref 0–37)
Albumin: 2.8 g/dL — ABNORMAL LOW (ref 3.5–5.2)
Alkaline Phosphatase: 224 U/L — ABNORMAL HIGH (ref 39–117)
BUN: 7 mg/dL (ref 6–23)
CO2: 22 mEq/L (ref 19–32)
Calcium: 9.6 mg/dL (ref 8.4–10.5)
Chloride: 102 mEq/L (ref 96–112)
Creatinine, Ser: 0.63 mg/dL (ref 0.50–1.10)
GFR calc Af Amer: >90 mL/min (ref >90)
GFR calc non Af Amer: >90 mL/min (ref >90)
Glucose, Bld: 104 mg/dL — ABNORMAL HIGH (ref 70–99)
Potassium: 3.9 mEq/L (ref 3.5–5.1)
Sodium: 134 mEq/L — ABNORMAL LOW (ref 135–145)
Total Bilirubin: 0.2 mg/dL — ABNORMAL LOW (ref 0.3–1.2)
Total Protein: 6 g/dL (ref 6.0–8.3)

## 2011-08-21 LAB — CBC
HCT: 37.5 % (ref 36.0–46.0)
Hemoglobin: 12.8 g/dL (ref 12.0–15.0)
MCH: 27.8 pg (ref 26.0–34.0)
MCHC: 34.1 g/dL (ref 30.0–36.0)
MCV: 81.5 fL (ref 78.0–100.0)
Platelets: 177 10*3/uL (ref 150–400)
RBC: 4.6 MIL/uL (ref 3.87–5.11)
RDW: 14.2 % (ref 11.5–15.5)
WBC: 7.3 10*3/uL (ref 4.0–10.5)

## 2011-08-21 LAB — URIC ACID: Uric Acid, Serum: 5.6 mg/dL (ref 2.4–7.0)

## 2011-08-21 LAB — STREP B DNA PROBE: GBS: NEGATIVE

## 2011-08-21 LAB — RPR: RPR Ser Ql: NONREACTIVE

## 2011-08-21 MED ORDER — IBUPROFEN 600 MG PO TABS: 600.0000 mg | ORAL_TABLET | Freq: Four times a day (QID) | ORAL | Status: DC | PRN

## 2011-08-21 MED ORDER — OXYTOCIN BOLUS FROM INFUSION
500.0000 mL | Freq: Once | INTRAVENOUS | Status: DC
  Filled 2011-08-21: qty 1000
  Filled 2011-08-21: qty 500

## 2011-08-21 MED ORDER — TERBUTALINE SULFATE 1 MG/ML IJ SOLN: 0.2500 mg | Freq: Once | INTRAMUSCULAR | Status: AC | PRN

## 2011-08-21 MED ORDER — OXYTOCIN 20 UNITS IN LACTATED RINGERS INFUSION - SIMPLE: 125.0000 mL/h | Freq: Once | INTRAVENOUS | Status: DC

## 2011-08-21 MED ORDER — LACTATED RINGERS IV SOLN
INTRAVENOUS | Status: DC
  Administered 2011-08-21 – 2011-08-22 (×2): via INTRAVENOUS

## 2011-08-21 MED ORDER — OXYCODONE-ACETAMINOPHEN 5-325 MG PO TABS: 2.0000 | ORAL_TABLET | ORAL | Status: DC | PRN

## 2011-08-21 MED ORDER — LACTATED RINGERS IV SOLN: 500.0000 mL | INTRAVENOUS | Status: DC | PRN

## 2011-08-21 MED ORDER — LIDOCAINE HCL (PF) 1 % IJ SOLN: 30.0000 mL | INTRAMUSCULAR | Status: DC | PRN

## 2011-08-21 MED ORDER — CITRIC ACID-SODIUM CITRATE 334-500 MG/5ML PO SOLN
30.0000 mL | ORAL | Status: DC | PRN
  Administered 2011-08-22: 30 mL via ORAL
  Filled 2011-08-21 (×2): qty 15

## 2011-08-21 MED ORDER — BUTORPHANOL TARTRATE 2 MG/ML IJ SOLN
2.0000 mg | Freq: Once | INTRAMUSCULAR | Status: AC
  Administered 2011-08-21: 2 mg via INTRAVENOUS
  Filled 2011-08-21: qty 1

## 2011-08-21 MED ORDER — ACETAMINOPHEN 325 MG PO TABS
650.0000 mg | ORAL_TABLET | ORAL | Status: DC | PRN
  Administered 2011-08-21: 650 mg via ORAL
  Filled 2011-08-21: qty 2

## 2011-08-21 MED ORDER — ONDANSETRON HCL 4 MG/2ML IJ SOLN
4.0000 mg | Freq: Four times a day (QID) | INTRAMUSCULAR | Status: DC | PRN
  Administered 2011-08-22: 4 mg via INTRAVENOUS
  Filled 2011-08-21: qty 2

## 2011-08-21 MED ORDER — LABETALOL HCL 5 MG/ML IV SOLN
20.0000 mg | Freq: Once | INTRAVENOUS | Status: AC
  Administered 2011-08-21: 20 mg via INTRAVENOUS
  Filled 2011-08-21: qty 4

## 2011-08-21 MED ORDER — OXYTOCIN 20 UNITS IN LACTATED RINGERS INFUSION - SIMPLE
1.0000 m[IU]/min | INTRAVENOUS | Status: DC
  Administered 2011-08-21: 12 m[IU]/min via INTRAVENOUS
  Administered 2011-08-21: 2 m[IU]/min via INTRAVENOUS
  Administered 2011-08-22: 28 m[IU]/min via INTRAVENOUS
  Filled 2011-08-21: qty 1000

## 2011-08-21 NOTE — Progress Notes (Signed)
Feeling ctx Afeb, VSS, BP 140-150/90-100 FHT- Cat I, ctx not tracing well VE- 4/90/0, vtx, IUPC and FSE Continue pitocin, monitor progress and BP

## 2011-08-21 NOTE — Progress Notes (Signed)
Feeling some ctx Afeb, VSS, BP 100-140/70-100 FHT- Cat I, ctx not tracing well VE- 4/90/0, vtx, AROM clear Continue pitocin and monitor progress

## 2011-08-21 NOTE — H&P (Signed)
Marie Price is a 20 y.o. female, G1 P0, EGA [redacted] weeks presenting for induction with favorable cervix and possible gestational hypertension.  Has had intermittent slightly elevated BP, normal labs, normal 24 hr urine.  Multiple phone calls during pregnancy, see prenatal records for complete history.  Maternal Medical History:  Fetal activity: Perceived fetal activity is normal.      OB History    Grav Para Term Preterm Abortions TAB SAB Ect Mult Living   1              Past Medical History  Diagnosis Date  . Asthma   . Pregnant state, incidental   . Urinary tract infection   . Abnormal Pap smear    Past Surgical History  Procedure Date  . No past surgeries    Family History: family history is negative for Anesthesia problems. Social History:  reports that she has never smoked. She has never used smokeless tobacco. She reports that she does not drink alcohol or use illicit drugs.  Review of Systems  Respiratory: Negative.   Cardiovascular: Negative.    VE-2-3/80/-1, vtx   Height 5\' 5"  (1.651 m), weight 122.018 kg (269 lb). Maternal Exam:  Abdomen: Patient reports no abdominal tenderness. Estimated fetal weight is 8 lbs.   Fetal presentation: vertex  Introitus: Normal vulva. Normal vagina.  Pelvis: adequate for delivery.   Cervix: Cervix evaluated by digital exam.     Fetal Exam Fetal Monitor Review: Mode: ultrasound.   Baseline rate: 150.  Variability: moderate (6-25 bpm).   Pattern: no accelerations and no decelerations.    Fetal State Assessment: Category I - tracings are normal.     Physical Exam  Constitutional: She appears well-developed and well-nourished.  Neck: Neck supple. No thyromegaly present.  Cardiovascular: Normal rate, regular rhythm and normal heart sounds.   No murmur heard. Respiratory: Effort normal. No respiratory distress. She has no wheezes.  GI: Soft.       Gravid     Prenatal labs: ABO, Rh: --/--/O POS (06/28 1846) Antibody:  Negative (06/13 0000) Rubella: Immune (06/13 0000) RPR: Nonreactive (06/13 0000)  HBsAg: Negative (06/13 0000)  HIV: Non-reactive (06/13 0000)  GBS: Negative (11/23 0000)   Assessment/Plan: IUP at 39+ weeks with favorable cervix and possible gestational hypertension.  Will start pitocin, monitor progress and BP.     Blong Busk D 08/21/2011, 10:28 AM

## 2011-08-22 ENCOUNTER — Encounter (HOSPITAL_COMMUNITY): Payer: Self-pay | Admitting: Anesthesiology

## 2011-08-22 ENCOUNTER — Encounter (HOSPITAL_COMMUNITY)
Admission: AD | Disposition: A | Discharge status: Home / Self Care | Payer: Self-pay | Source: Ambulatory Visit | Attending: Obstetrics and Gynecology

## 2011-08-22 ENCOUNTER — Encounter (HOSPITAL_COMMUNITY): Payer: Self-pay | Admitting: *Deleted

## 2011-08-22 ENCOUNTER — Inpatient Hospital Stay (HOSPITAL_COMMUNITY): Payer: Medicaid Other | Admitting: Anesthesiology

## 2011-08-22 SURGERY — Surgical Case
Anesthesia: Epidural | Site: Abdomen | Wound class: Clean Contaminated

## 2011-08-22 MED ORDER — SIMETHICONE 80 MG PO CHEW
80.0000 mg | CHEWABLE_TABLET | Freq: Three times a day (TID) | ORAL | Status: DC
  Administered 2011-08-22 – 2011-08-25 (×11): 80 mg via ORAL

## 2011-08-22 MED ORDER — SENNOSIDES-DOCUSATE SODIUM 8.6-50 MG PO TABS
2.0000 | ORAL_TABLET | Freq: Every day | ORAL | Status: DC
  Administered 2011-08-22 – 2011-08-24 (×3): 2 via ORAL

## 2011-08-22 MED ORDER — DIPHENHYDRAMINE HCL 25 MG PO CAPS
25.0000 mg | ORAL_CAPSULE | ORAL | Status: DC | PRN
  Filled 2011-08-22: qty 1

## 2011-08-22 MED ORDER — LANOLIN HYDROUS EX OINT: 1.0000 "application " | TOPICAL_OINTMENT | CUTANEOUS | Status: DC | PRN

## 2011-08-22 MED ORDER — ONDANSETRON HCL 4 MG/2ML IJ SOLN
INTRAMUSCULAR | Status: AC
  Filled 2011-08-22: qty 2

## 2011-08-22 MED ORDER — NALBUPHINE SYRINGE 5 MG/0.5 ML
5.0000 mg | INJECTION | INTRAMUSCULAR | Status: DC | PRN
  Administered 2011-08-22: 5 mg via SUBCUTANEOUS
  Filled 2011-08-22: qty 1

## 2011-08-22 MED ORDER — SCOPOLAMINE 1 MG/3DAYS TD PT72
1.0000 | MEDICATED_PATCH | Freq: Once | TRANSDERMAL | Status: AC
  Administered 2011-08-22: 1.5 mg via TRANSDERMAL

## 2011-08-22 MED ORDER — DIPHENHYDRAMINE HCL 50 MG/ML IJ SOLN
12.5000 mg | INTRAMUSCULAR | Status: DC | PRN
  Administered 2011-08-22: 12.5 mg via INTRAVENOUS
  Filled 2011-08-22: qty 1

## 2011-08-22 MED ORDER — MAGNESIUM HYDROXIDE 400 MG/5ML PO SUSP: 30.0000 mL | ORAL | Status: DC | PRN

## 2011-08-22 MED ORDER — TETANUS-DIPHTH-ACELL PERTUSSIS 5-2.5-18.5 LF-MCG/0.5 IM SUSP
0.5000 mL | Freq: Once | INTRAMUSCULAR | Status: AC
  Administered 2011-08-23: 0.5 mL via INTRAMUSCULAR
  Filled 2011-08-22: qty 0.5

## 2011-08-22 MED ORDER — SODIUM BICARBONATE 8.4 % IV SOLN
INTRAVENOUS | Status: AC
  Filled 2011-08-22: qty 50

## 2011-08-22 MED ORDER — SCOPOLAMINE 1 MG/3DAYS TD PT72
MEDICATED_PATCH | TRANSDERMAL | Status: AC
  Administered 2011-08-22: 1.5 mg via TRANSDERMAL
  Filled 2011-08-22: qty 1

## 2011-08-22 MED ORDER — LIDOCAINE-EPINEPHRINE (PF) 2 %-1:200000 IJ SOLN
INTRAMUSCULAR | Status: AC
  Filled 2011-08-22: qty 20

## 2011-08-22 MED ORDER — LACTATED RINGERS IV SOLN
INTRAVENOUS | Status: DC | PRN
  Administered 2011-08-22 (×3): via INTRAVENOUS

## 2011-08-22 MED ORDER — ONDANSETRON HCL 4 MG/2ML IJ SOLN: 4.0000 mg | Freq: Three times a day (TID) | INTRAMUSCULAR | Status: DC | PRN

## 2011-08-22 MED ORDER — PRENATAL PLUS 27-1 MG PO TABS
1.0000 | ORAL_TABLET | Freq: Every day | ORAL | Status: DC
  Administered 2011-08-23 – 2011-08-25 (×3): 1 via ORAL
  Filled 2011-08-22 (×4): qty 1

## 2011-08-22 MED ORDER — OXYTOCIN 10 UNIT/ML IJ SOLN
INTRAMUSCULAR | Status: AC
  Filled 2011-08-22: qty 4

## 2011-08-22 MED ORDER — PHENYLEPHRINE 40 MCG/ML (10ML) SYRINGE FOR IV PUSH (FOR BLOOD PRESSURE SUPPORT): 80.0000 ug | PREFILLED_SYRINGE | INTRAVENOUS | Status: DC | PRN

## 2011-08-22 MED ORDER — KETOROLAC TROMETHAMINE 30 MG/ML IJ SOLN: 30.0000 mg | Freq: Four times a day (QID) | INTRAMUSCULAR | Status: AC | PRN

## 2011-08-22 MED ORDER — MEASLES, MUMPS & RUBELLA VAC ~~LOC~~ INJ
0.5000 mL | INJECTION | Freq: Once | SUBCUTANEOUS | Status: DC
  Filled 2011-08-22: qty 0.5

## 2011-08-22 MED ORDER — EPHEDRINE 5 MG/ML INJ: 10.0000 mg | INTRAVENOUS | Status: DC | PRN

## 2011-08-22 MED ORDER — FENTANYL 2.5 MCG/ML BUPIVACAINE 1/10 % EPIDURAL INFUSION (WH - ANES)
INTRAMUSCULAR | Status: DC | PRN
  Administered 2011-08-22: 14 mL/h via EPIDURAL

## 2011-08-22 MED ORDER — DIBUCAINE 1 % RE OINT: 1.0000 "application " | TOPICAL_OINTMENT | RECTAL | Status: DC | PRN

## 2011-08-22 MED ORDER — ONDANSETRON HCL 4 MG/2ML IJ SOLN
INTRAMUSCULAR | Status: DC | PRN
  Administered 2011-08-22: 4 mg via INTRAVENOUS

## 2011-08-22 MED ORDER — SODIUM CHLORIDE 0.9 % IR SOLN
Status: DC | PRN
  Administered 2011-08-22: 1

## 2011-08-22 MED ORDER — PHENYLEPHRINE 40 MCG/ML (10ML) SYRINGE FOR IV PUSH (FOR BLOOD PRESSURE SUPPORT)
80.0000 ug | PREFILLED_SYRINGE | INTRAVENOUS | Status: DC | PRN
  Filled 2011-08-22: qty 5

## 2011-08-22 MED ORDER — EPHEDRINE 5 MG/ML INJ
10.0000 mg | INTRAVENOUS | Status: DC | PRN
  Filled 2011-08-22: qty 4

## 2011-08-22 MED ORDER — MEPERIDINE HCL 25 MG/ML IJ SOLN: 6.2500 mg | INTRAMUSCULAR | Status: DC | PRN

## 2011-08-22 MED ORDER — NALBUPHINE SYRINGE 5 MG/0.5 ML
5.0000 mg | INJECTION | INTRAMUSCULAR | Status: DC | PRN
  Filled 2011-08-22 (×2): qty 1

## 2011-08-22 MED ORDER — CEFAZOLIN SODIUM-DEXTROSE 2-3 GM-% IV SOLR
2.0000 g | Freq: Once | INTRAVENOUS | Status: AC
  Administered 2011-08-22: 2 g via INTRAVENOUS
  Filled 2011-08-22: qty 50

## 2011-08-22 MED ORDER — MENTHOL 3 MG MT LOZG: 1.0000 | LOZENGE | OROMUCOSAL | Status: DC | PRN

## 2011-08-22 MED ORDER — MORPHINE SULFATE (PF) 0.5 MG/ML IJ SOLN
INTRAMUSCULAR | Status: DC | PRN
  Administered 2011-08-22: 3 mg via EPIDURAL

## 2011-08-22 MED ORDER — FENTANYL 2.5 MCG/ML BUPIVACAINE 1/10 % EPIDURAL INFUSION (WH - ANES)
14.0000 mL/h | INTRAMUSCULAR | Status: DC
  Filled 2011-08-22: qty 60

## 2011-08-22 MED ORDER — DIPHENHYDRAMINE HCL 50 MG/ML IJ SOLN: 25.0000 mg | INTRAMUSCULAR | Status: DC | PRN

## 2011-08-22 MED ORDER — ONDANSETRON HCL 4 MG PO TABS
4.0000 mg | ORAL_TABLET | ORAL | Status: DC | PRN
  Administered 2011-08-24: 4 mg via ORAL
  Filled 2011-08-22: qty 1

## 2011-08-22 MED ORDER — WITCH HAZEL-GLYCERIN EX PADS: 1.0000 "application " | MEDICATED_PAD | CUTANEOUS | Status: DC | PRN

## 2011-08-22 MED ORDER — OXYTOCIN 20 UNITS IN LACTATED RINGERS INFUSION - SIMPLE
125.0000 mL/h | INTRAVENOUS | Status: AC
  Administered 2011-08-22: 125 mL/h via INTRAVENOUS
  Filled 2011-08-22: qty 1000

## 2011-08-22 MED ORDER — NALOXONE HCL 0.4 MG/ML IJ SOLN
0.4000 mg | INTRAMUSCULAR | Status: DC | PRN
Start: 2011-08-22 — End: 2011-08-25

## 2011-08-22 MED ORDER — NALOXONE HCL 0.4 MG/ML IJ SOLN
1.0000 ug/kg/h | INTRAMUSCULAR | Status: DC | PRN
  Filled 2011-08-22: qty 2.5

## 2011-08-22 MED ORDER — OXYCODONE-ACETAMINOPHEN 5-325 MG PO TABS
1.0000 | ORAL_TABLET | ORAL | Status: DC | PRN
  Administered 2011-08-23 – 2011-08-25 (×8): 1 via ORAL
  Filled 2011-08-22 (×8): qty 1

## 2011-08-22 MED ORDER — PROMETHAZINE HCL 25 MG/ML IJ SOLN: 6.2500 mg | INTRAMUSCULAR | Status: DC | PRN

## 2011-08-22 MED ORDER — DIPHENHYDRAMINE HCL 25 MG PO CAPS
25.0000 mg | ORAL_CAPSULE | Freq: Four times a day (QID) | ORAL | Status: DC | PRN
  Administered 2011-08-22: 25 mg via ORAL

## 2011-08-22 MED ORDER — OXYTOCIN 20 UNITS IN LACTATED RINGERS INFUSION - SIMPLE
INTRAVENOUS | Status: DC | PRN
  Administered 2011-08-22: 20 [IU] via INTRAVENOUS

## 2011-08-22 MED ORDER — KETOROLAC TROMETHAMINE 30 MG/ML IJ SOLN: 15.0000 mg | Freq: Once | INTRAMUSCULAR | Status: DC | PRN

## 2011-08-22 MED ORDER — ONDANSETRON HCL 4 MG/2ML IJ SOLN: 4.0000 mg | INTRAMUSCULAR | Status: DC | PRN

## 2011-08-22 MED ORDER — DIPHENHYDRAMINE HCL 50 MG/ML IJ SOLN: 12.5000 mg | INTRAMUSCULAR | Status: DC | PRN

## 2011-08-22 MED ORDER — SODIUM CHLORIDE 0.9 % IJ SOLN: 3.0000 mL | INTRAMUSCULAR | Status: DC | PRN

## 2011-08-22 MED ORDER — IBUPROFEN 600 MG PO TABS
600.0000 mg | ORAL_TABLET | Freq: Four times a day (QID) | ORAL | Status: DC
  Administered 2011-08-22 – 2011-08-25 (×11): 600 mg via ORAL
  Filled 2011-08-22 (×4): qty 1

## 2011-08-22 MED ORDER — HYDROMORPHONE HCL PF 1 MG/ML IJ SOLN: 0.2500 mg | INTRAMUSCULAR | Status: DC | PRN

## 2011-08-22 MED ORDER — KETOROLAC TROMETHAMINE 60 MG/2ML IM SOLN
60.0000 mg | Freq: Once | INTRAMUSCULAR | Status: AC | PRN
  Administered 2011-08-22: 60 mg via INTRAMUSCULAR

## 2011-08-22 MED ORDER — LIDOCAINE HCL 1.5 % IJ SOLN
INTRAMUSCULAR | Status: DC | PRN
  Administered 2011-08-22 (×2): 5 mL via EPIDURAL

## 2011-08-22 MED ORDER — SODIUM BICARBONATE 8.4 % IV SOLN
INTRAVENOUS | Status: DC | PRN
  Administered 2011-08-22: 5 mL via EPIDURAL

## 2011-08-22 MED ORDER — KETOROLAC TROMETHAMINE 60 MG/2ML IM SOLN
INTRAMUSCULAR | Status: AC
  Administered 2011-08-22: 60 mg via INTRAMUSCULAR
  Filled 2011-08-22: qty 2

## 2011-08-22 MED ORDER — LACTATED RINGERS IV SOLN: 500.0000 mL | Freq: Once | INTRAVENOUS | Status: DC

## 2011-08-22 MED ORDER — SIMETHICONE 80 MG PO CHEW: 80.0000 mg | CHEWABLE_TABLET | ORAL | Status: DC | PRN

## 2011-08-22 MED ORDER — IBUPROFEN 600 MG PO TABS
600.0000 mg | ORAL_TABLET | Freq: Four times a day (QID) | ORAL | Status: DC | PRN
  Filled 2011-08-22 (×7): qty 1

## 2011-08-22 MED ORDER — MORPHINE SULFATE 0.5 MG/ML IJ SOLN
INTRAMUSCULAR | Status: AC
  Filled 2011-08-22: qty 10

## 2011-08-22 MED ORDER — ZOLPIDEM TARTRATE 5 MG PO TABS: 5.0000 mg | ORAL_TABLET | Freq: Every evening | ORAL | Status: DC | PRN

## 2011-08-22 SURGICAL SUPPLY — 27 items
CHLORAPREP W/TINT 26ML (MISCELLANEOUS) ×2 IMPLANT
CLOTH BEACON ORANGE TIMEOUT ST (SAFETY) ×2 IMPLANT
CONTAINER PREFILL 10% NBF 15ML (MISCELLANEOUS) IMPLANT
DRSG COVADERM 4X8 (GAUZE/BANDAGES/DRESSINGS) ×2 IMPLANT
ELECT REM PT RETURN 9FT ADLT (ELECTROSURGICAL) ×2
ELECTRODE REM PT RTRN 9FT ADLT (ELECTROSURGICAL) ×1 IMPLANT
EXTRACTOR VACUUM KIWI (MISCELLANEOUS) IMPLANT
EXTRACTOR VACUUM M CUP 4 TUBE (SUCTIONS) IMPLANT
GLOVE BIO SURGEON STRL SZ8 (GLOVE) ×2 IMPLANT
GLOVE ORTHO TXT STRL SZ7.5 (GLOVE) ×2 IMPLANT
GOWN PREVENTION PLUS LG XLONG (DISPOSABLE) ×4 IMPLANT
KIT ABG SYR 3ML LUER SLIP (SYRINGE) IMPLANT
NEEDLE HYPO 25X5/8 SAFETYGLIDE (NEEDLE) IMPLANT
NS IRRIG 1000ML POUR BTL (IV SOLUTION) ×2 IMPLANT
PACK C SECTION WH (CUSTOM PROCEDURE TRAY) ×2 IMPLANT
RTRCTR C-SECT PINK 25CM LRG (MISCELLANEOUS) ×2 IMPLANT
SLEEVE SCD COMPRESS KNEE MED (MISCELLANEOUS) ×2 IMPLANT
STAPLER VISISTAT 35W (STAPLE) ×2 IMPLANT
SUT CHROMIC 1 CTX 36 (SUTURE) ×6 IMPLANT
SUT PLAIN 0 NONE (SUTURE) IMPLANT
SUT PLAIN 2 0 XLH (SUTURE) ×4 IMPLANT
SUT PROLENE 4 0 KS NEEDLE (SUTURE) IMPLANT
SUT VIC AB 0 CT1 27 (SUTURE) ×2
SUT VIC AB 0 CT1 27XBRD ANBCTR (SUTURE) ×2 IMPLANT
TOWEL OR 17X24 6PK STRL BLUE (TOWEL DISPOSABLE) ×4 IMPLANT
TRAY FOLEY CATH 14FR (SET/KITS/TRAYS/PACK) ×2 IMPLANT
WATER STERILE IRR 1000ML POUR (IV SOLUTION) ×2 IMPLANT

## 2011-08-22 NOTE — Progress Notes (Signed)
Feeling ctx Afeb, VSS, BP has been as high as 160/100-better after IV Labetalol FHT- Cat II, mod variability, early or late decels, some variable decels, ctx not tracing well-IUPC came out VE- 4-5/90/-1, vtx, IUPC replaced Will continue pitocin and monitor FHT with new IUPC.  Discussed protracted labor and that she may still be in latent labor or may not dilate any more.

## 2011-08-22 NOTE — Anesthesia Postprocedure Evaluation (Signed)
Anesthesia Post Note  Patient: Marie Price  Procedure(s) Performed:  CESAREAN SECTION - primary of baby girl  at [phone]  APGAR 9/9  Anesthesia type: Epidural  Patient location: Mother/Baby  Post pain: Pain level controlled  Post assessment: Post-op Vital signs reviewed  Last Vitals:  Filed Vitals:   08/22/11 1408  BP: 131/84  Pulse: 91  Temp: 36.8 C  Resp: 18    Post vital signs: Reviewed  Level of consciousness: awake  Complications: No apparent anesthesia complications

## 2011-08-22 NOTE — Progress Notes (Signed)
Comfortable with epidural Afeb, VSS, BP improved with epidural FHT- Cat II, ctx q 2-3 min VE- unchanged per RN Has been 4 cm for greater than 12 hrs.  Will proceed with c-section for arrest of dilation.  Procedure and risks discussed with pt.

## 2011-08-22 NOTE — Transfer of Care (Signed)
Immediate Anesthesia Transfer of Care Note  Patient: Marie Price  Procedure(s) Performed:  CESAREAN SECTION - primary of baby girl  at 0634  APGAR 9/9  Patient Location: PACU  Anesthesia Type: Epidural  Level of Consciousness: awake, alert , oriented and patient cooperative  Airway & Oxygen Therapy: Patient Spontanous Breathing  Post-op Assessment: Report given to PACU RN and Post -op Vital signs reviewed and stable  Post vital signs: Reviewed  Complications: No apparent anesthesia complications

## 2011-08-22 NOTE — Op Note (Signed)
Preoperative diagnosis: Intrauterine pregnancy at 39 weeks, arrest of dilation, gestational hypertension Postoperative diagnosis: Same Procedure: Primary low transverse cesarean section without extensions Surgeon: Lavina Hamman M.D. Anesthesia: Epidural Findings: Patient had normal gravid anatomy and delivered a viable female infant with Apgars of 9 and 9 weighing7 lbs. 13 oz. Estimated blood loss: 800 cc Specimens: Placenta sent to labor and delivery Complications: None  Procedure in detail: The patient was taken to the operating room and placed in the dorsosupine position. Her previously placed epidural was dosed appropriately. Abdomen was then prepped and draped in the usual sterile fashion. A Foley catheter had previously been placed. The level of her anesthesia was found to be adequate. Abdomen was entered via a standard Pfannenstiel incision. Once the peritoneal cavity was entered the Alexis disposable self-retaining retractor was placed and good visualization was achieved. A 4 cm transverse incision was then made in the lower uterine segment pushing the bladder inferior. Once the uterine cavity was entered the incision was extended digitally. The fetal vertex was grasped and delivered through the incision atraumatically. Mouth and nares were suctioned. The remainder of the infant then delivered atraumatically. Cord was doubly clamped and cut and the infant handed to the awaiting pediatric team. Cord blood was obtained. The placenta delivered manually. Uterus was wiped dry with clean lap pad and all clots and debris were removed. Uterine incision was inspected and found to be free of extensions. Uterine incision was closed in 2 layers with the first layer being a running locking layer with #1 chromic and the second layer being an imbricating layer also with #1 chromic. Bleeding from right angle controlled with figure 8 #1 chromic.  Tubes and ovaries were inspected and found to be normal. Uterine  incision was inspected and found to be hemostatic. Bleeding from serosal edges was controlled with electrocautery. The Alexis retractor was removed. Subfascial space was irrigated and made hemostatic with electrocautery. Fascia was closed in running fashion starting at both ends and meeting in the middle with 0 Vicryl. Subcutaneous tissue was then irrigated and made hemostatic with electrocautery. Subcutaneous tissue closed with running 2-0 plain gut.  Skin was closed with staples followed by a sterile dressing. Patient tolerated the procedure well and was taken to the recovery in stable condition. Counts were correct x2, she received Ancef 2 g IV at the beginning of the procedure and she had PAS hose on throughout the procedure.

## 2011-08-22 NOTE — Anesthesia Postprocedure Evaluation (Signed)
  Anesthesia Post-op Note  Patient: Marie Price  Procedure(s) Performed:  CESAREAN SECTION - primary of baby girl  at 0634  APGAR 9/9   Patient is awake, responsive, moving her legs, and has signs of resolution of her numbness. Pain and nausea are reasonably well controlled. Vital signs are stable and clinically acceptable. Oxygen saturation is clinically acceptable. There are no apparent anesthetic complications at this time. Patient is ready for discharge.

## 2011-08-22 NOTE — Addendum Note (Signed)
Addendum  created 08/22/11 1422 by Doreene Burke   Modules edited:Notes Section

## 2011-08-22 NOTE — Anesthesia Preprocedure Evaluation (Signed)
Anesthesia Evaluation  Patient identified by MRN, date of birth, ID band Patient awake    Reviewed: Allergy & Precautions, H&P , NPO status , Patient's Chart, lab work & pertinent test results  Airway Mallampati: III TM Distance: >3 FB Neck ROM: full    Dental No notable dental hx.    Pulmonary asthma ,    Pulmonary exam normal       Cardiovascular neg cardio ROS     Neuro/Psych Negative Neurological ROS  Negative Psych ROS   GI/Hepatic negative GI ROS, Neg liver ROS,   Endo/Other  Morbid obesity  Renal/GU negative Renal ROS  Genitourinary negative   Musculoskeletal negative musculoskeletal ROS (+)   Abdominal (+) obese,   Peds negative pediatric ROS (+)  Hematology negative hematology ROS (+)   Anesthesia Other Findings   Reproductive/Obstetrics (+) Pregnancy                           Anesthesia Physical Anesthesia Plan  ASA: III  Anesthesia Plan: Epidural   Post-op Pain Management:    Induction:   Airway Management Planned:   Additional Equipment:   Intra-op Plan:   Post-operative Plan:   Informed Consent: I have reviewed the patients History and Physical, chart, labs and discussed the procedure including the risks, benefits and alternatives for the proposed anesthesia with the patient or authorized representative who has indicated his/her understanding and acceptance.     Plan Discussed with:   Anesthesia Plan Comments:         Anesthesia Quick Evaluation

## 2011-08-22 NOTE — OR Nursing (Signed)
Fundal massage DLWegner RN 

## 2011-08-22 NOTE — Anesthesia Procedure Notes (Signed)
Epidural Patient location during procedure: OB Start time: 08/22/2011 2:50 AM End time: 08/22/2011 2:58 AM Reason for block: procedure for pain  Staffing Anesthesiologist: Sandrea Hughs Performed by: anesthesiologist   Preanesthetic Checklist Completed: patient identified, site marked, surgical consent, pre-op evaluation, timeout performed, IV checked, risks and benefits discussed and monitors and equipment checked  Epidural Patient position: sitting Prep: site prepped and draped and DuraPrep Patient monitoring: continuous pulse ox and blood pressure Approach: midline Injection technique: LOR air  Needle:  Needle type: Tuohy  Needle gauge: 17 G Needle length: 9 cm Needle insertion depth: 9 cm Catheter type: closed end flexible Catheter size: 19 Gauge Catheter at skin depth: 15 cm Test dose: negative and 1.5% lidocaine  Assessment Sensory level: T8 Events: blood not aspirated, injection not painful, no injection resistance, negative IV test and no paresthesia

## 2011-08-23 LAB — CBC
MCH: 27.4 pg (ref 26.0–34.0)
MCV: 82.8 fL (ref 78.0–100.0)
Platelets: 135 10*3/uL — ABNORMAL LOW (ref 150–400)
RDW: 14.6 % (ref 11.5–15.5)

## 2011-08-23 NOTE — Progress Notes (Signed)
Patient ID: Marie Price, female   DOB: Dec 17, 1990, 20 y.o.   MRN: [id number] #1 afebrile BP high normal No headache or epigastric pain. Tolerating a diet, ambulating Will watch BP carefully.

## 2011-08-23 NOTE — Progress Notes (Signed)
PSYCHOSOCIAL ASSESSMENT ~ MATERNAL/CHILD Name:   Marie Price Age 20 Referral Date  08/23/11 Reason/Source  Social Issues with FOB  I. FAMILY/HOME ENVIRONMENT A. Child's Legal Guardian x Parent(s)  Marie Price parent    DSS  Name  Marie Price DOB  09-Jun-1991 Age  40   Address[address] [place] [address]     Name   DOB Age  Address  Other Household Members/Support Persons Name  Marie Price Relationship  Mother DOB        Name  Marie Price                   Relationship  Father                   DOB        Name                   Relationship                   DOB                   Name                   Relationship                   DOB C.   Other Support   II. PSYCHOSOCIAL DATA A. Information Source x Patient Interview   Family Interview           Other B. Surveyor, quantity and [employer]  Employment  [employer]  Medicaid  x   [employer]                                  Self Pay   Food Stamps      WIC Yes  Work Scientist, physiological Housing      Section 8     Maternity Care Coordination/Child Service Coordination/Early Intervention    School  Grade      Other Cultural and Environment Information Cultural Issues Impacting Care  III. STRENGTHS  Supportive family/friends Yes  Adequate Resources  Yes Compliance with medical plan Yes  Home prepared for Child (including basic supplies)  Yes Understanding of illness          N/A Other  IV. RISK FACTORS AND CURRENT PROBLEMS      No Problems Note   Substance Abuse                                             Pt Family             Mental Illness     Pt Family               Family/Relationship Issues   Pt Family      Abuse/Neglect/Domestic Violence   Pt Family   Financial Resources   Yes  Pt Family x FOB Issues with money for meds  Transportation     Pt Family  DSS Involvement    Pt Family  Adjustment to Illness    Pt Family   Knowledge/Cognitive  Deficit   Pt  Family   Compliance with Treatment   Pt Family   Basic Needs (food, housing, etc)  Pt Family  Housing Concerns    Pt Family  Other             V. SOCIAL WORK ASSESSMENT CSW met with pt, discussed social issues concerning FOB.  Pt reports unable to obtain meds for migraines for FOB.  No safety concerns expressed by pt.  Discussed insurance, pt has Medicaid and WIC, and does not report any issues at this time.  Also discussed concerns for support and supplies when discharged, pt again did not express any concerns at this time.  Pt did have questions about breast feeding and finding a pediatrician, referred to RN.  Pt currently lives with mother and father and they are her primary support.  Please reconsult if any further needs arise.  VI. SOCIAL WORK PLAN (In [place]) No Further Intervention Required/No Barriers to Discharge Psychosocial Support and Ongoing Assessment of Needs Patient/Family  Education Child Protective Services Report  [place]        Date Information/Referral to [facility]   Other

## 2011-08-24 ENCOUNTER — Encounter (HOSPITAL_COMMUNITY): Payer: Self-pay | Admitting: Obstetrics and Gynecology

## 2011-08-24 NOTE — Progress Notes (Signed)
UR chart review completed.  

## 2011-08-24 NOTE — Progress Notes (Signed)
POD#2 Doing ok Afeb, VSS, slightly tachy Abd- soft, fundus firm, incision intact Continue routine care

## 2011-08-25 MED ORDER — IBUPROFEN 600 MG PO TABS: 600.0000 mg | ORAL_TABLET | Freq: Four times a day (QID) | ORAL | Status: AC | PRN

## 2011-08-25 MED ORDER — CITALOPRAM HYDROBROMIDE 20 MG PO TABS: 20.0000 mg | ORAL_TABLET | Freq: Every day | ORAL | Status: DC

## 2011-08-25 MED ORDER — OXYCODONE-ACETAMINOPHEN 5-325 MG PO TABS: 1.0000 | ORAL_TABLET | ORAL | Status: AC | PRN

## 2011-08-25 NOTE — Discharge Summary (Signed)
Obstetric Discharge Summary Reason for Admission: induction of labor Prenatal Procedures: none Intrapartum Procedures: cesarean: low cervical, transverse Postpartum Procedures: none Complications-Operative and Postpartum: none Hemoglobin  Date Value Range Status  08/23/2011 10.2* 12.0-15.0 (g/dL) Final     DELTA CHECK NOTED     REPEATED TO VERIFY     HCT  Date Value Range Status  08/23/2011 30.8* 36.0-46.0 (%) Final    Discharge Diagnoses: Term Pregnancy-delivered and arrest of dilation  Discharge Information: Date: 08/25/2011 Activity: pelvic rest and no strenuous activity Diet: routine Medications: Ibuprofen, Percocet and celexa Condition: stable Instructions: refer to practice specific booklet Discharge to: home Follow-up Information    Follow up with Zaydee Aina D, MD. Make an appointment in 6 days. (for staple removal)    Contact information:   [address] [place] [address] [phone]          Newborn Data: Live born female  Birth Weight: 7 lb 13.8 oz (3565 g) APGAR: 9, 9  Home with mother.  Marie Price 08/25/2011, 11:32 AM

## 2011-08-25 NOTE — Progress Notes (Signed)
POD#3 Doing ok, worried about baby and feeding Afeb, VSS Abd- soft, fundus firm, incision ok Will continue current care, d/c home tomorrow.

## 2012-04-11 ENCOUNTER — Telehealth: Payer: Self-pay

## 2012-04-11 NOTE — Telephone Encounter (Signed)
Pharmacy still not covering Depo, wants to move forward with Mirena.  ld

## 2013-02-07 IMAGING — CT CT HEAD W/O CM
1 series · 16 of 30 positions shown, 20 images · non-contrast
Comparison: None.

CLINICAL DATA: Near-syncope; nausea.  35 weeks pregnant.

CT HEAD WITHOUT CONTRAST
TECHNIQUE: Contiguous axial images were obtained from the base of
the skull through the vertex without contrast.

[Series 2: headseq 4.8 h37s · axial · 0.45mm/px · z∈[+109,+240]mm · 16 of 30 slices shown, 20 images]
[im 2/30  brain]
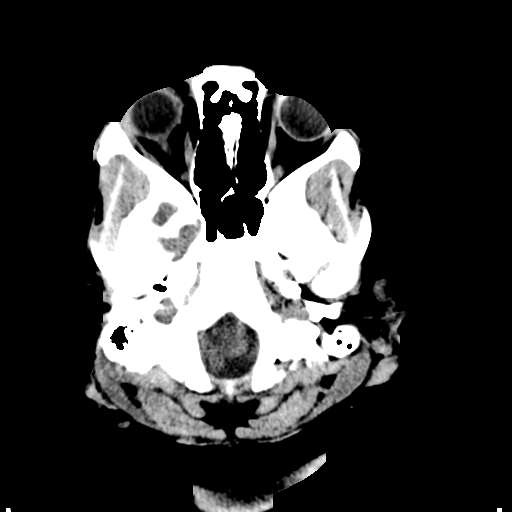
[im 2/30  bone]
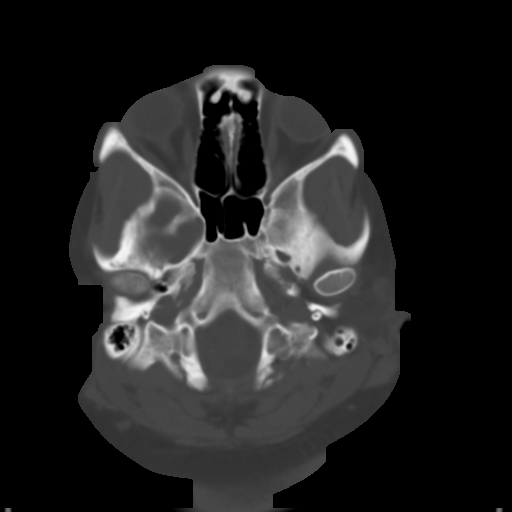
[im 4/30  brain]
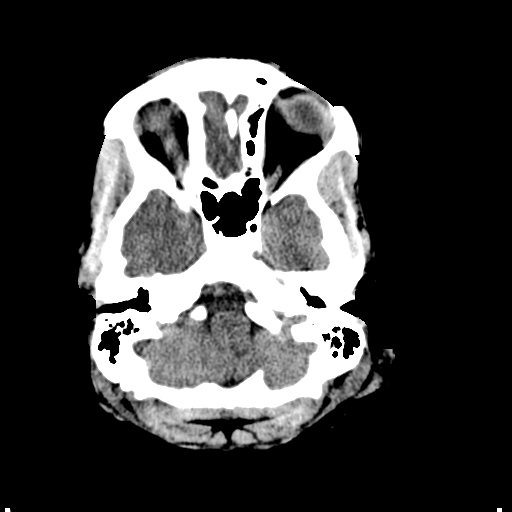
[im 6/30  brain]
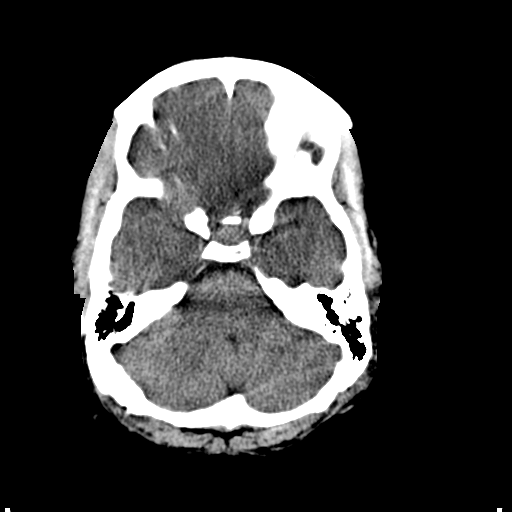
[im 8/30  brain]
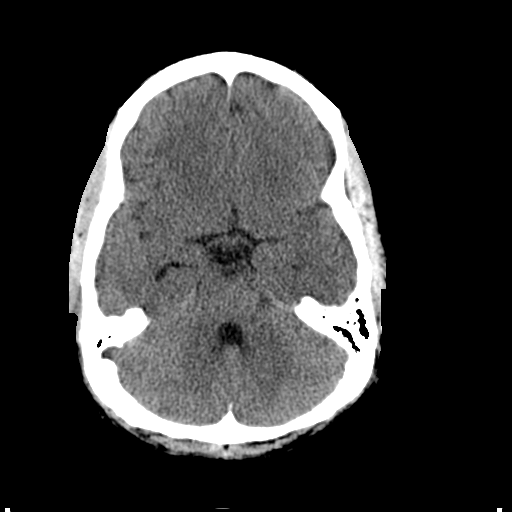
[im 9/30  brain]
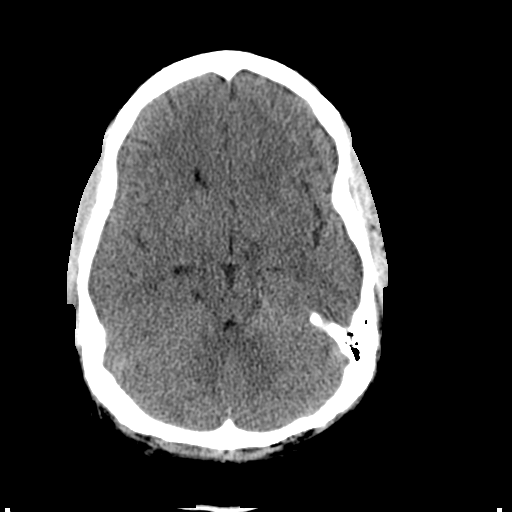
[im 9/30  bone]
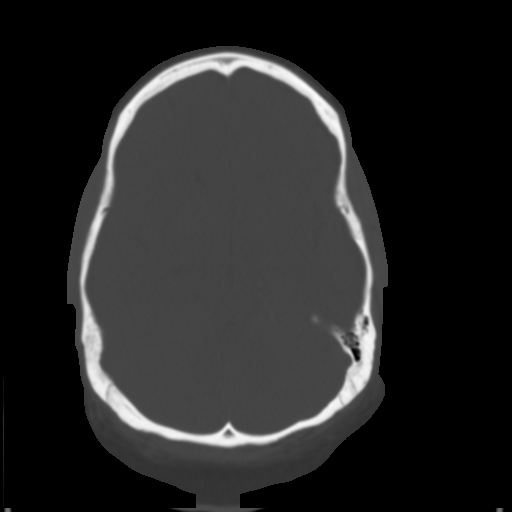
[im 11/30  brain]
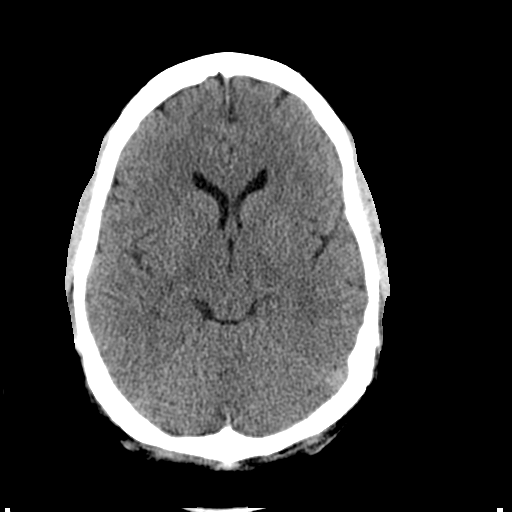
[im 13/30  brain]
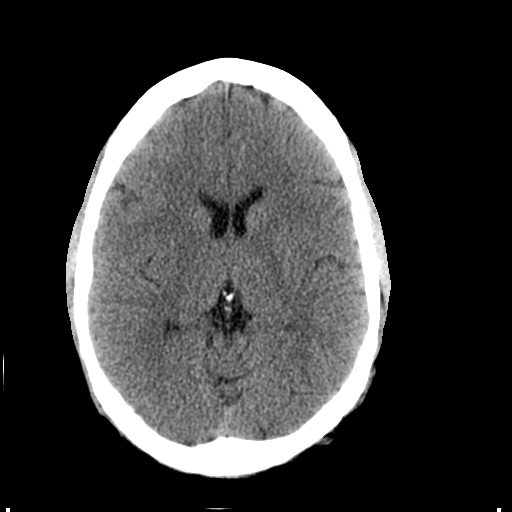
[im 15/30  brain]
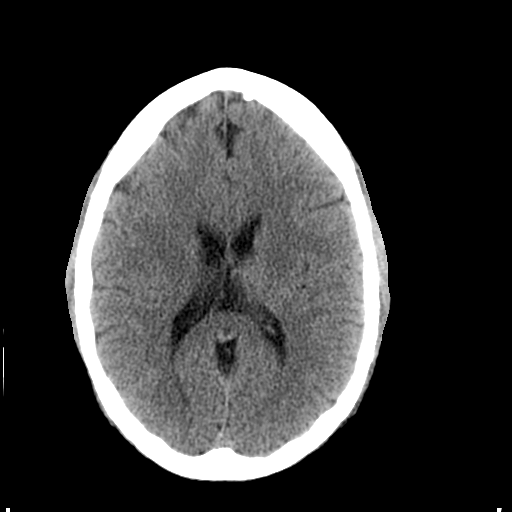
[im 16/30  brain]
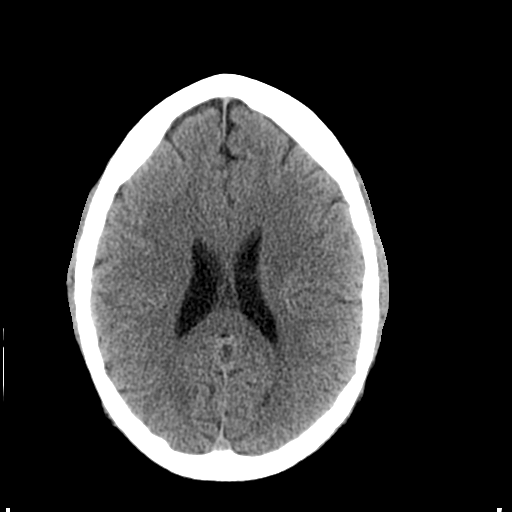
[im 16/30  bone]
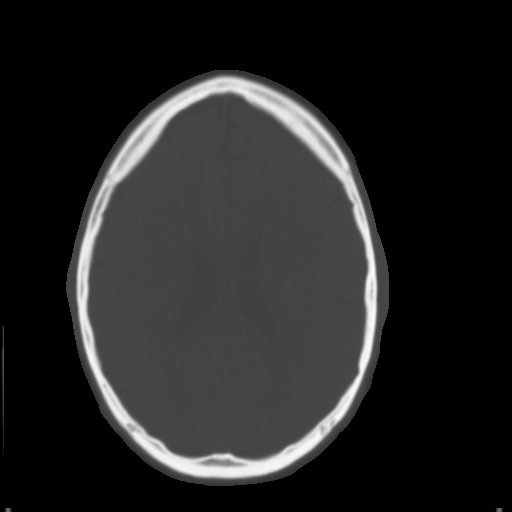
[im 18/30  brain]
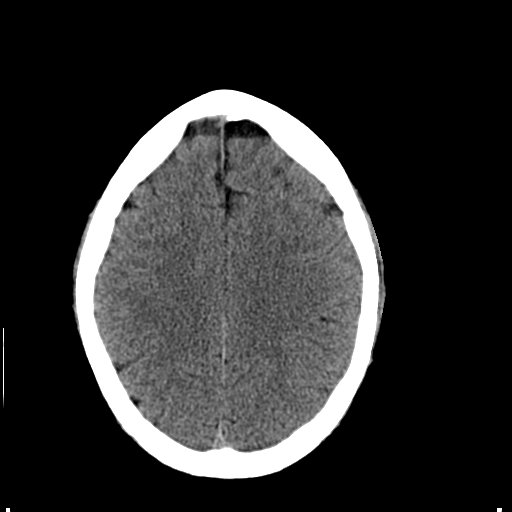
[im 20/30  brain]
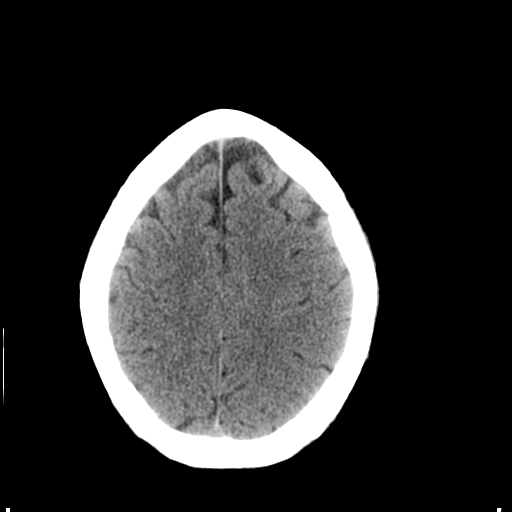
[im 22/30  brain]
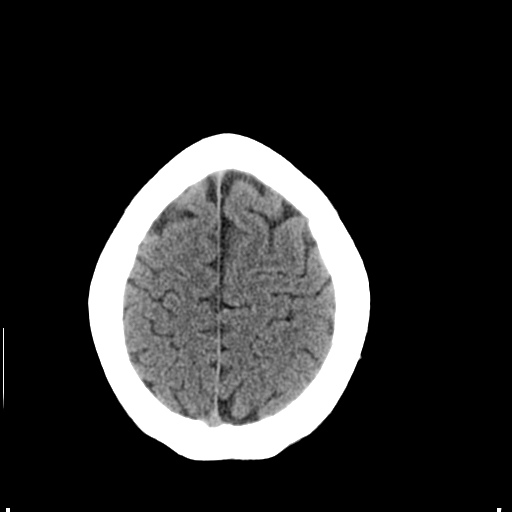
[im 23/30  brain]
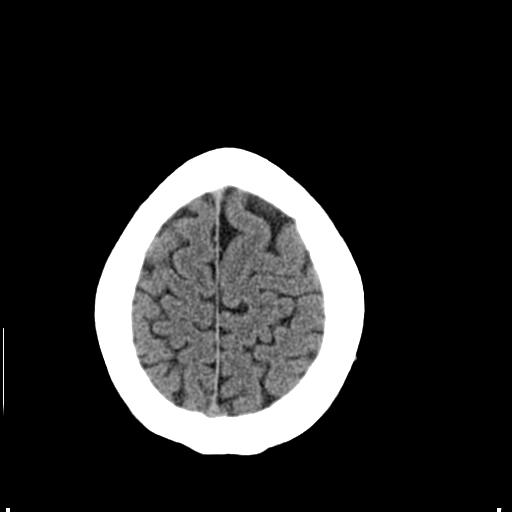
[im 23/30  bone]
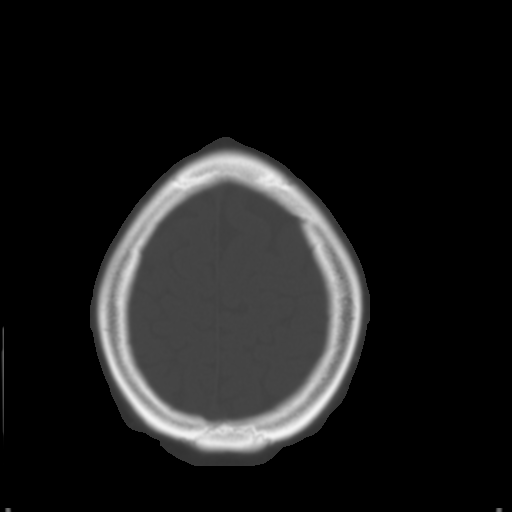
[im 25/30  brain]
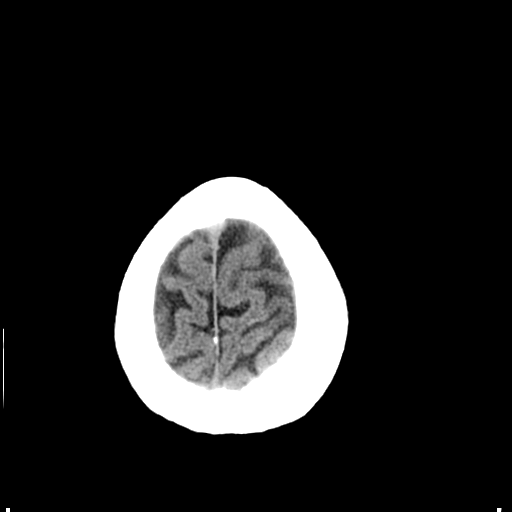
[im 27/30  brain]
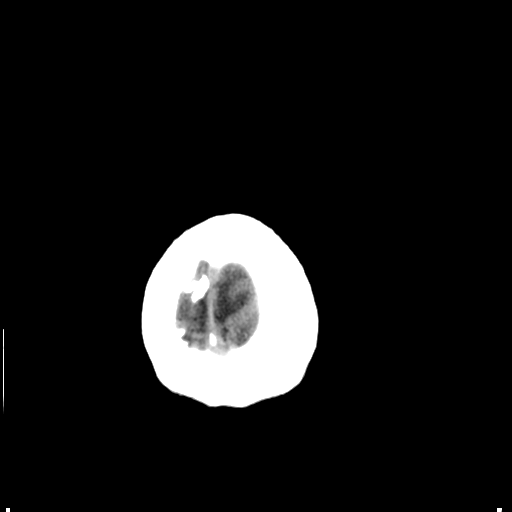
[im 29/30  brain]
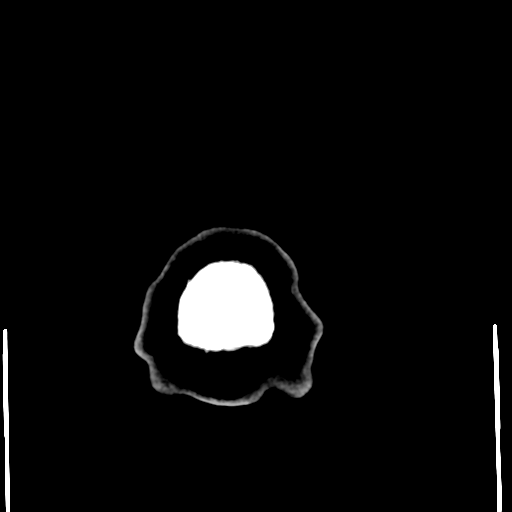

[16 of 30 positions shown; findings below may reference images not displayed]

FINDINGS: There is no evidence of acute infarction, mass lesion, or
intra- or extra-axial hemorrhage on CT.

The posterior fossa, including the cerebellum, brainstem and fourth
ventricle, is within normal limits.  The third and lateral
ventricles, and basal ganglia are unremarkable in appearance.  The
cerebral hemispheres are symmetric in appearance, with normal gray-
white differentiation.  No mass effect or midline shift is seen.

There is no evidence of fracture; visualized osseous structures are
unremarkable in appearance.  The visualized portions of the orbits
are within normal limits.  The paranasal sinuses and mastoid air
cells are well-aerated.  No significant soft tissue abnormalities
are seen.
IMPRESSION: Unremarkable noncontrast CT of the head.

## 2014-07-30 ENCOUNTER — Encounter (HOSPITAL_COMMUNITY): Payer: Self-pay | Admitting: Obstetrics and Gynecology

## 2022-08-22 ENCOUNTER — Other Ambulatory Visit: Payer: Self-pay

## 2022-08-22 ENCOUNTER — Emergency Department (HOSPITAL_COMMUNITY)
Admission: EM | Admit: 2022-08-22 | Discharge: 2022-08-23 | Disposition: A | Discharge status: Home / Self Care | Payer: Medicaid Other | Attending: Emergency Medicine | Admitting: Emergency Medicine

## 2022-08-22 ENCOUNTER — Encounter (HOSPITAL_COMMUNITY): Payer: Self-pay

## 2022-08-22 DIAGNOSIS — Z794 Long term (current) use of insulin: Secondary | ICD-10-CM | POA: Insufficient documentation

## 2022-08-22 DIAGNOSIS — R000 Tachycardia, unspecified: Secondary | ICD-10-CM | POA: Diagnosis not present

## 2022-08-22 DIAGNOSIS — Z7984 Long term (current) use of oral hypoglycemic drugs: Secondary | ICD-10-CM | POA: Insufficient documentation

## 2022-08-22 DIAGNOSIS — E1165 Type 2 diabetes mellitus with hyperglycemia: Secondary | ICD-10-CM | POA: Diagnosis not present

## 2022-08-22 DIAGNOSIS — R739 Hyperglycemia, unspecified: Secondary | ICD-10-CM

## 2022-08-22 DIAGNOSIS — R3589 Other polyuria: Secondary | ICD-10-CM | POA: Diagnosis present

## 2022-08-22 DIAGNOSIS — R7989 Other specified abnormal findings of blood chemistry: Secondary | ICD-10-CM

## 2022-08-22 LAB — CBC WITH DIFFERENTIAL/PLATELET
Abs Immature Granulocytes: 0.03 K/uL (ref 0.00–0.07)
Basophils Absolute: 0 K/uL (ref 0.0–0.1)
Basophils Relative: 0 %
Eosinophils Absolute: 0.2 K/uL (ref 0.0–0.5)
Eosinophils Relative: 3 %
HCT: 45.6 % (ref 36.0–46.0)
Hemoglobin: 15.6 g/dL — ABNORMAL HIGH (ref 12.0–15.0)
Immature Granulocytes: 0 %
Lymphocytes Relative: 41 %
Lymphs Abs: 3.1 K/uL (ref 0.7–4.0)
MCH: 28.3 pg (ref 26.0–34.0)
MCHC: 34.2 g/dL (ref 30.0–36.0)
MCV: 82.8 fL (ref 80.0–100.0)
Monocytes Absolute: 0.4 K/uL (ref 0.1–1.0)
Monocytes Relative: 6 %
Neutro Abs: 3.8 K/uL (ref 1.7–7.7)
Neutrophils Relative %: 50 %
Platelets: 271 K/uL (ref 150–400)
RBC: 5.51 MIL/uL — ABNORMAL HIGH (ref 3.87–5.11)
RDW: 11.9 % (ref 11.5–15.5)
WBC: 7.6 K/uL (ref 4.0–10.5)
nRBC: 0 % (ref 0.0–0.2)

## 2022-08-22 LAB — COMPREHENSIVE METABOLIC PANEL
ALT: 43 U/L (ref 0–44)
AST: 36 U/L (ref 15–41)
Albumin: 4.4 g/dL (ref 3.5–5.0)
Alkaline Phosphatase: 114 U/L (ref 38–126)
Anion gap: 9 (ref 5–15)
BUN: 14 mg/dL (ref 6–20)
CO2: 25 mmol/L (ref 22–32)
Calcium: 9.4 mg/dL (ref 8.9–10.3)
Chloride: 96 mmol/L — ABNORMAL LOW (ref 98–111)
Creatinine, Ser: 0.97 mg/dL (ref 0.44–1.00)
GFR, Estimated: >60 mL/min (ref >60)
Glucose, Bld: 538 mg/dL (ref 70–99)
Potassium: 4.2 mmol/L (ref 3.5–5.1)
Sodium: 130 mmol/L — ABNORMAL LOW (ref 135–145)
Total Bilirubin: 0.6 mg/dL (ref 0.3–1.2)
Total Protein: 7.8 g/dL (ref 6.5–8.1)

## 2022-08-22 LAB — URINALYSIS, ROUTINE W REFLEX MICROSCOPIC
Bacteria, UA: NONE SEEN
Bilirubin Urine: NEGATIVE
Glucose, UA: >=500 mg/dL — AB
Ketones, ur: 5 mg/dL — AB
Leukocytes,Ua: NEGATIVE
Nitrite: NEGATIVE
Protein, ur: NEGATIVE mg/dL
RBC / HPF: >50 RBC/hpf — ABNORMAL HIGH (ref 0–5)
Specific Gravity, Urine: 1.031 — ABNORMAL HIGH (ref 1.005–1.030)
pH: 6 (ref 5.0–8.0)

## 2022-08-22 LAB — BLOOD GAS, VENOUS
Acid-Base Excess: 1.6 mmol/L (ref 0.0–2.0)
Bicarbonate: 26.6 mmol/L (ref 20.0–28.0)
O2 Saturation: 88.1 %
Patient temperature: 37
pCO2, Ven: 42 mmHg — ABNORMAL LOW (ref 44–60)
pH, Ven: 7.41 (ref 7.25–7.43)
pO2, Ven: 53 mmHg — ABNORMAL HIGH (ref 32–45)

## 2022-08-22 LAB — PREGNANCY, URINE: Preg Test, Ur: NEGATIVE

## 2022-08-22 LAB — CBG MONITORING, ED
Glucose-Capillary: 403 mg/dL — ABNORMAL HIGH (ref 70–99)
Glucose-Capillary: 482 mg/dL — ABNORMAL HIGH (ref 70–99)

## 2022-08-22 MED ORDER — METFORMIN HCL 500 MG PO TABS: 500.0000 mg | ORAL_TABLET | Freq: Every day | ORAL | 0 refills | Status: DC

## 2022-08-22 MED ORDER — INSULIN ASPART 100 UNIT/ML IJ SOLN
5.0000 [IU] | Freq: Once | INTRAMUSCULAR | Status: AC
  Administered 2022-08-22: 5 [IU] via SUBCUTANEOUS
  Filled 2022-08-22: qty 0.05

## 2022-08-22 MED ORDER — LACTATED RINGERS IV BOLUS
1000.0000 mL | Freq: Once | INTRAVENOUS | Status: AC
  Administered 2022-08-22: 1000 mL via INTRAVENOUS

## 2022-08-22 MED ORDER — BLOOD GLUCOSE MONITOR KIT: PACK | 0 refills | Status: DC

## 2022-08-22 NOTE — ED Provider Notes (Signed)
[facility] DEPT Provider Note   CSN: [id number] Arrival date & time: 08/22/22  2114    History  Chief Complaint  Patient presents with   Blurred Vision   Polyuria    Dlisa Barnwell is a 31 y.o. female who has a past medical history here for evaluation of hyperglycemia.  No history of similar.  There is a family history of type 2 diabetes.  Has had about a week of polyuria, polydipsia.  Some blurred vision over the last few weeks. Wear glasses, has not seen Optho. No headache, numbness or weakness.  No unilateral vision changes.  States she feels like she has been drinking plenty however it is not enough.  Currently on her menstrual cycle.  Urine multiple times daily without any dysuria. Seen by [facility] PTA who noted CBG > 450 and urine with glucose and small ketones. Sent for further evaluation. She does not have a PCP. No pain, emesis. Admits to some generalized myalgias without congestion, rhinorrhea. No sick contacts  HPI     Home Medications Prior to Admission medications   Medication Sig Start Date End Date Taking? Authorizing Provider  blood glucose meter kit and supplies KIT Dispense based on patient and insurance preference. Use up to four times daily as directed. 08/22/22  Yes Roxene Alviar A, PA-C  gabapentin (NEURONTIN) 300 MG capsule Take 300 mg by mouth 3 (three) times daily as needed (pain). 07/17/21 08/24/22 Yes [provider]  Misc Natural Products (ELDERBERRY IMMUNE COMPLEX) CHEW Chew 1 tablet by mouth daily.   Yes [provider]  Pediatric Multiple Vitamins (CHILDRENS MULTIVITAMIN) chewable tablet Chew 1 tablet by mouth daily.   Yes [provider]  Vitamin D, Cholecalciferol, 10 MCG (400 UNIT) CHEW Chew 1 tablet by mouth daily.   Yes [provider]  Zinc Sulfate (ZINCATE PO) Take 1 tablet by mouth daily.   Yes [provider]  citalopram (CELEXA) 20 MG tablet Take 1 tablet (20 mg total)  by mouth daily. 08/25/11 08/24/12  Meisinger, Sherren Mocha, MD  metFORMIN (GLUCOPHAGE) 500 MG tablet Take 1 tablet (500 mg total) by mouth daily with breakfast. 08/22/22 09/21/22  Emmaleigh Longo A, PA-C      Allergies    Patient has no known allergies.    Review of Systems   Review of Systems  Constitutional:  Positive for fatigue.  HENT: Negative.    Eyes:  Positive for visual disturbance.  Respiratory: Negative.    Cardiovascular: Negative.   Gastrointestinal: Negative.   Endocrine: Positive for polydipsia, polyphagia and polyuria.  Genitourinary: Negative.   Musculoskeletal:  Positive for myalgias.  Skin: Negative.   Neurological: Negative.   All other systems reviewed and are negative.   Physical Exam Updated Vital Signs BP (!) 147/115   Pulse 90   Temp 98.2 F (36.8 C) (Oral)   Resp 18   Ht _0  (1.651 m)   Wt 136.1 kg   SpO2 99%   BMI 49.92 kg/m  Physical Exam Vitals and nursing note reviewed.  Constitutional:      General: She is not in acute distress.    Appearance: She is well-developed. She is not ill-appearing, toxic-appearing or diaphoretic.  HENT:     Head: Normocephalic and atraumatic.     Nose: Nose normal.     Mouth/Throat:     Mouth: Mucous membranes are moist.  Eyes:     Pupils: Pupils are equal, round, and reactive to light.  Cardiovascular:  Rate and Rhythm: Tachycardia present.  Pulmonary:     Effort: Pulmonary effort is normal. No respiratory distress.     Breath sounds: Normal breath sounds.  Abdominal:     General: Bowel sounds are normal. There is no distension.     Palpations: Abdomen is soft.     Tenderness: There is no abdominal tenderness. There is no right CVA tenderness, left CVA tenderness, guarding or rebound.  Musculoskeletal:        General: No swelling, tenderness, deformity or signs of injury. Normal range of motion.     Cervical back: Normal range of motion.     Right lower leg: No edema.     Left lower leg: No edema.   Skin:    General: Skin is warm and dry.     Capillary Refill: Capillary refill takes less than 2 seconds.  Neurological:     General: No focal deficit present.     Mental Status: She is alert and oriented to person, place, and time.     Cranial Nerves: No cranial nerve deficit.     Sensory: No sensory deficit.     Motor: No weakness.     Gait: Gait normal.  Psychiatric:        Mood and Affect: Mood normal.    ED Results / Procedures / Treatments   Labs (all labs ordered are listed, but only abnormal results are displayed) Labs Reviewed  CBC WITH DIFFERENTIAL/PLATELET - Abnormal; Notable for the following components:      Result Value   RBC 5.51 (*)    Hemoglobin 15.6 (*)    All other components within normal limits  COMPREHENSIVE METABOLIC PANEL - Abnormal; Notable for the following components:   Sodium 130 (*)    Chloride 96 (*)    Glucose, Bld 538 (*)    All other components within normal limits  URINALYSIS, ROUTINE W REFLEX MICROSCOPIC - Abnormal; Notable for the following components:   Color, Urine STRAW (*)    Specific Gravity, Urine 1.031 (*)    Glucose, UA >=500 (*)    Hgb urine dipstick LARGE (*)    Ketones, ur 5 (*)    RBC / HPF >50 (*)    All other components within normal limits  BLOOD GAS, VENOUS - Abnormal; Notable for the following components:   pCO2, Ven 42 (*)    pO2, Ven 53 (*)    All other components within normal limits  CBG MONITORING, ED - Abnormal; Notable for the following components:   Glucose-Capillary 482 (*)    All other components within normal limits  CBG MONITORING, ED - Abnormal; Notable for the following components:   Glucose-Capillary 403 (*)    All other components within normal limits  PREGNANCY, URINE    EKG None  Radiology No results found.  Procedures Procedures    Medications Ordered in ED Medications  lactated ringers bolus 1,000 mL (1,000 mLs Intravenous New Bag/Given 08/22/22 2230)  insulin aspart (novoLOG)  injection 5 Units (5 Units Subcutaneous Given 08/22/22 2229)   ED Course/ Medical Decision Making/ A&P                31 year old here for evaluation of hyperglycemia. Ongoing polyuria, polydipsia. No hx of DM. Mild blurred vision for some time. Non focal neuro exam without deficits. Seen at [facility] today noted to have hyperglycemia and sent here to ED. She does not have PCP however UC sent referral for this and Endocrinologist. She appear  otherwise well. I suspect her blurred vision and polyuria with polydipsia from her new onset DM.  Low suspicion for CVA, dissection, MS, optic neuritis, infectious process.  Will plan on labs, treat hyperglycemia and reassess  Labs personally viewed and interpreted:  Preg neg UA with blood however on menstrual cycle.5 ketones VBG without acidosis, normal Bicarb CBC without leukocytosis CMP pseudohyponatremia setting of hyperglycemia.  Glucose 538, anion gap 9  Patient given IVF and insulin.     Patient eating upon entering room on recheck. Recheck CBG at 403, downtrend from 538>> Has only gotten 250 cc IVF so far. Will dc home once IVF complete. Discussed low carb diet and starting meds for hyperglycemia.  Low suspicion for DKA, HHS. Will start on Metformin and have her FU outpatient.  The patient has been appropriately medically screened and/or stabilized in the ED. I have low suspicion for any other emergent medical condition which would require further screening, evaluation or treatment in the ED or require inpatient management.  Patient is hemodynamically stable and in no acute distress.  Patient able to ambulate in department prior to ED.  Evaluation does not show acute pathology that would require ongoing or additional emergent interventions while in the emergency department or further inpatient treatment.  I have discussed the diagnosis with the patient and answered all questions.  Pain is been managed while in the emergency department and patient has no  further complaints prior to discharge.  Patient is comfortable with plan discussed in room and is stable for discharge at this time.  I have discussed strict return precautions for returning to the emergency department.  Patient was encouraged to follow-up with PCP/specialist refer to at discharge.               Medical Decision Making Amount and/or Complexity of Data Reviewed Independent Historian: friend External Data Reviewed: labs and notes. Labs: ordered. Decision-making details documented in ED Course.  Risk OTC drugs. Prescription drug management. Parenteral controlled substances. Decision regarding hospitalization. Diagnosis or treatment significantly limited by social determinants of health.         Final Clinical Impression(s) / ED Diagnoses Final diagnoses:  Hyperglycemia  Pseudohyponatremia    Rx / DC Orders ED Discharge Orders          Ordered    metFORMIN (GLUCOPHAGE) 500 MG tablet  Daily with breakfast,   Status:  Discontinued        08/22/22 2324    blood glucose meter kit and supplies KIT        08/22/22 2351    metFORMIN (GLUCOPHAGE) 500 MG tablet  Daily with breakfast        08/22/22 2351              Joelene Barriere A, PA-C 08/22/22 2356    Drenda Freeze, MD 08/23/22 1506

## 2022-08-22 NOTE — ED Triage Notes (Signed)
Pt reports having blurred vision and polyuria today. Pt is not diagnosed with diabetes but states that her blood sugar was checked and it was 487. Pt states that she does not feel well.

## 2022-08-22 NOTE — Discharge Instructions (Addendum)
Take the medication as prescribed. Be aware that this mediation will likely cause diarrhea in the beginning. This typically tapers off the long you are on this medication.  Follow up with Primary care provider and Endocrinology that you were referred to by Urgent care  Return for new or worsening symptoms

## 2022-08-25 ENCOUNTER — Encounter: Payer: Self-pay | Admitting: Internal Medicine

## 2022-08-25 ENCOUNTER — Ambulatory Visit (INDEPENDENT_AMBULATORY_CARE_PROVIDER_SITE_OTHER): Payer: Medicaid Other | Admitting: Internal Medicine

## 2022-08-25 VITALS — BP 142/84 | HR 94 | Resp 16 | Ht 65.0 in | Wt 291.0 lb

## 2022-08-25 DIAGNOSIS — E1165 Type 2 diabetes mellitus with hyperglycemia: Secondary | ICD-10-CM | POA: Insufficient documentation

## 2022-08-25 DIAGNOSIS — E1159 Type 2 diabetes mellitus with other circulatory complications: Secondary | ICD-10-CM | POA: Diagnosis not present

## 2022-08-25 DIAGNOSIS — E1122 Type 2 diabetes mellitus with diabetic chronic kidney disease: Secondary | ICD-10-CM

## 2022-08-25 DIAGNOSIS — I152 Hypertension secondary to endocrine disorders: Secondary | ICD-10-CM | POA: Diagnosis not present

## 2022-08-25 LAB — POCT GLYCOSYLATED HEMOGLOBIN (HGB A1C): HbA1c, POC (controlled diabetic range): 12.9 % — AB (ref 0.0–7.0)

## 2022-08-25 MED ORDER — INSULIN GLARGINE 100 UNIT/ML SOLOSTAR PEN: 10.0000 [IU] | PEN_INJECTOR | Freq: Every day | SUBCUTANEOUS | 0 refills | Status: DC

## 2022-08-25 MED ORDER — INSULIN GLARGINE 100 UNITS/ML SOLOSTAR PEN: 10.0000 [IU] | PEN_INJECTOR | Freq: Every day | SUBCUTANEOUS | 0 refills | Status: DC

## 2022-08-25 MED ORDER — OLMESARTAN MEDOXOMIL 20 MG PO TABS: 20.0000 mg | ORAL_TABLET | Freq: Every day | ORAL | 0 refills | Status: DC

## 2022-08-25 MED ORDER — METFORMIN HCL 500 MG PO TABS: 500.0000 mg | ORAL_TABLET | Freq: Every day | ORAL | 0 refills | Status: DC

## 2022-08-25 NOTE — Assessment & Plan Note (Signed)
Patient's BP today is 157/91 with a goal of <130/80. Previously on antihypertensive, but not recently following with a primary care provider.  Assessment/plan: Chronic ,  uncontrolled - Start olmesartan 20 mg daily - Check BMP

## 2022-08-25 NOTE — Patient Instructions (Addendum)
Thank you for trusting me with your care. To recap, today we discussed the following:   Type 2 diabetes mellitus   - continue to increase metformin dose every 7 days as instructed below - Start lantus. Take nightly and check your fasting blood glucose every morning. Record this reading. Follow up in 4 weeks to review. If you have any concerns before, please call and make a sooner appointment.  - metFORMIN (GLUCOPHAGE) 500 MG tablet; Take 1 tablet (500 mg total) by mouth daily with breakfast. Take 500 mg twice daily x1 week, then 1,000 mg in the AM and 500 mg in the PM x1 week, and finally 1,000 mg twice daily. Take with food.  Dispense: 90 tablet; Refill: 0 - insulin glargine (LANTUS) 100 unit/mL SOPN; Inject 10 Units into the skin at bedtime.  Dispense: 15 mL; Refill: 0 - Ambulatory referral to diabetic education - BMP8+EGFR  Hypertension   - olmesartan (BENICAR) 20 MG tablet; Take 1 tablet (20 mg total) by mouth daily.  Dispense: 60 tablet; Refill: 0 - BMP8+EGFR   Future orders: Before next visit come for these labs fasting. BMP and lipid panel

## 2022-08-25 NOTE — Assessment & Plan Note (Addendum)
New diabetic. Current A1c 12.9%. Patient has a glucometer.Does not have with her today, but blood glucose >300 on morning checks. She was started on Metformin on Sunday. Her dose was increased yesterday to 1000 mg. Currently having some blurry vision. 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 She is having polyuria.   Assessment/Plan: New diabetic, with severe hyperglycemia.  - Patient counseled on increase activity and cutting out all sugar sweetened beverages. - Referral to diabetic educator -Continue metformin and ramping up to 1000 mg, will stop at tolerated dose or 2000 mg daily Starting Lantus 10 units nightly given severe hyperglycemia and blurred vision.  Once patient has titrated metformin I would like to start GLP-1. -Follow-up in 4 weeks - Lipid panel ordered , to be obtained fasting before next visit

## 2022-08-25 NOTE — Progress Notes (Signed)
     CC: Establish Care and Diabetes (New diabetic- seen at urgent care yesterday and sugar was in the 300s)     HPI:Marie Price is a 31 y.o. female who presents to establish care and for evaluation of recent diagnosis of diabetes. For the details of today's visit, please refer to the assessment and plan.  Past Medical History:  Diagnosis Date   Abnormal Pap smear    Asthma    Pregnant state, incidental    Urinary tract infection     Past Surgical History:  Procedure Laterality Date   CESAREAN SECTION  08/22/2011   Procedure: CESAREAN SECTION;  Surgeon: Zenaida Niece, MD;  Location: [facility];  Service: Gynecology;  Laterality: N/A;  primary of baby girl  at 33  APGAR 9/9   NO PAST SURGERIES      Family History  Problem Relation Age of Onset   Hypertension Mother    Diabetes Mother    Hypertension Father    Diabetes Father    Anesthesia problems Neg Hx     Social History   Tobacco Use   Smoking status: Never   Smokeless tobacco: Never  Substance Use Topics   Alcohol use: No   Drug use: No    Physical Exam: Vitals:   08/25/22 0939 08/25/22 1023  BP: (!) 157/91 (!) 142/84  Pulse: 94   Resp: 16   SpO2: 96%   Weight: 291 lb (132 kg)   Height: 5\' 5"  (1.651 m)      Physical Exam Constitutional:      General: She is not in acute distress.    Appearance: She is normal weight. She is not ill-appearing.  Eyes:     General: No scleral icterus.    Conjunctiva/sclera: Conjunctivae normal.  Cardiovascular:     Rate and Rhythm: Normal rate and regular rhythm.     Heart sounds: No murmur heard.    No friction rub.  Pulmonary:     Effort: Pulmonary effort is normal.     Breath sounds: No wheezing, rhonchi or rales.  Psychiatric:        Mood and Affect: Mood normal.        Behavior: Behavior normal.      Assessment & Plan:   Type 2 diabetes mellitus with hyperglycemia (HCC) New diabetic. Current A1c 12.9%. Patient has a glucometer.Does not have  with her today, but blood glucose >300 on morning checks. She was started on Metformin on Sunday. Her dose was increased yesterday to 1000 mg. Currently having some blurry vision. 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 She is having polyuria.   Assessment/Plan: New diabetic, with severe hyperglycemia.  - Patient counseled on increase activity and cutting out all sugar sweetened beverages. - Referral to diabetic educator -Continue metformin and ramping up to 1000 mg, will stop at tolerated dose or 2000 mg daily Starting Lantus 10 units nightly given severe hyperglycemia and blurred vision.  Once patient has titrated metformin I would like to start GLP-1. -Follow-up in 4 weeks - Lipid panel ordered , to be obtained fasting before next visit  Hypertension associated with diabetes (HCC) Patient's BP today is 157/91 with a goal of <130/80. Previously on antihypertensive, but not recently following with a primary care provider.  Assessment/plan: Chronic ,  uncontrolled - Start olmesartan 20 mg daily - Check BMP    Friday, MD

## 2022-08-26 ENCOUNTER — Telehealth: Payer: Self-pay

## 2022-08-26 ENCOUNTER — Telehealth: Payer: Self-pay | Admitting: Internal Medicine

## 2022-08-26 NOTE — Progress Notes (Signed)
Called patient and reviewed labs. Patient has sever hyperglycemia and borderline low bicarbonate, suggesting she is headed toward DKA. She is feeling fatigued, . No nausea, vomiting, or confusion. She is picking up Lantus prescribed. I recommended going to ED if she is unable to start Lantus today or if her symptoms worsen. She will call and schedule follow up for next week. She will monitor blood glucose daily.

## 2022-08-26 NOTE — Telephone Encounter (Signed)
Dr Barbaraann Faster gave her a letter that stated to remain out of work until her vision improves (uncontrolled diabetic) and she is needing a note with dates sent to her mychart. Please advise since Barbaraann Faster will not be here until Monday

## 2022-08-26 NOTE — Telephone Encounter (Signed)
Patient called letter she received does not have when to return to work or how long to stay out of work. Must have dates. Please redo and upload in mychart per patient.

## 2022-08-27 ENCOUNTER — Encounter: Payer: Self-pay | Admitting: Internal Medicine

## 2022-08-27 LAB — BMP8+EGFR
BUN/Creatinine Ratio: 11 (ref 9–23)
BUN: 10 mg/dL (ref 6–20)
CO2: 19 mmol/L — ABNORMAL LOW (ref 20–29)
Calcium: 9.7 mg/dL (ref 8.7–10.2)
Chloride: 95 mmol/L — ABNORMAL LOW (ref 96–106)
Creatinine, Ser: 0.93 mg/dL (ref 0.57–1.00)
Glucose: 674 mg/dL (ref 70–99)
Potassium: 4.9 mmol/L (ref 3.5–5.2)
Sodium: 133 mmol/L — ABNORMAL LOW (ref 134–144)
eGFR: 85 mL/min/{1.73_m2} (ref >59)

## 2022-09-02 ENCOUNTER — Ambulatory Visit (INDEPENDENT_AMBULATORY_CARE_PROVIDER_SITE_OTHER): Payer: Medicaid Other | Admitting: Internal Medicine

## 2022-09-02 ENCOUNTER — Telehealth: Payer: Self-pay | Admitting: Internal Medicine

## 2022-09-02 ENCOUNTER — Encounter: Payer: Self-pay | Admitting: Internal Medicine

## 2022-09-02 VITALS — BP 138/88 | HR 105 | Resp 16 | Ht 65.0 in | Wt 295.0 lb

## 2022-09-02 DIAGNOSIS — E1165 Type 2 diabetes mellitus with hyperglycemia: Secondary | ICD-10-CM

## 2022-09-02 MED ORDER — INSULIN GLARGINE 100 UNIT/ML SOLOSTAR PEN: 20.0000 [IU] | PEN_INJECTOR | Freq: Every day | SUBCUTANEOUS | 0 refills | Status: DC

## 2022-09-02 MED ORDER — METFORMIN HCL ER (MOD) 1000 MG PO TB24: 1000.0000 mg | ORAL_TABLET | Freq: Two times a day (BID) | ORAL | 1 refills | Status: DC

## 2022-09-02 NOTE — Progress Notes (Unsigned)
     CC: Diabetes (Follow up on diabetes and vision and FMLA form )    HPI:Marie Price is a 31 y.o. female who presents for evaluation of type 2 diabetes mellitus. For the details of today's visit, please refer to the assessment and plan.   Past Medical History:  Diagnosis Date   Abnormal Pap smear    Asthma    Pregnant state, incidental    Urinary tract infection      Physical Exam: Vitals:   09/02/22 0952  BP: 138/88  Pulse: (!) 105  Resp: 16  SpO2: 94%  Weight: 295 lb (133.8 kg)  Height: 5\' 5"  (1.651 m)     Physical Exam Constitutional:      General: She is not in acute distress.    Appearance: She is not ill-appearing.  Eyes:     General: No scleral icterus.    Conjunctiva/sclera: Conjunctivae normal.     Funduscopic exam:    Right eye: Red reflex present.        Left eye: Red reflex present. Cardiovascular:     Rate and Rhythm: Normal rate and regular rhythm.     Heart sounds: No murmur heard.    No friction rub.  Pulmonary:     Effort: Pulmonary effort is normal.     Breath sounds: No wheezing, rhonchi or rales.      Assessment & Plan:   Type 2 diabetes mellitus with hyperglycemia (HCC) Patient has titrated up her Metformin to 2000 mg daily. She has a loose bowel movement 2 hours after each time she takes Metformin. This started with 500 mg dose. She is tolerating, but doesn't know how she could manage when returning to work. Her blood glucose is >250 on all morning checks. Blurry vision is improving, but not back to normal.   Assessment/Plan: Type 2 Diabetes Mellitus with hyperglycemia. Will try metformin XR . Instructions given to titrate up Lantus as follows: If blood glucose >180 increase Lantus by 4 units every 3 days . Follow up in 2 weeks to see if she is tolerating Metformin. Will discuss starting additional oral medications at this visit.  - metFORMIN (GLUMETZA) 1000 MG (MOD) 24 hr tablet; Take 1 tablet (1,000 mg total) by mouth 2  (two) times daily with a meal.  Dispense: 60 tablet; Refill: 1 - insulin glargine (LANTUS) 100 UNIT/ML Solostar Pen; Inject 20 Units into the skin at bedtime.  Dispense: 15 mL; Refill: 0    , MD

## 2022-09-02 NOTE — Patient Instructions (Signed)
Thank you for trusting me with your care. To recap, today we discussed the following:   Type 2 diabetes mellitus with hyperglycemia  - metFORMIN (GLUMETZA) 1000 MG (MOD) 24 hr tablet; Take 1 tablet (1,000 mg total) by mouth 2 (two) times daily with a meal.  Dispense: 60 tablet; Refill: 1 - insulin glargine (LANTUS) 100 UNIT/ML Solostar Pen; Inject 20 Units into the skin at bedtime.  Dispense: 15 mL; Refill: 0  Titrate up your Lantus at home:  Check fasting glucose in the morning If glucose >180 , increase by 4 units every 3 days   Follow up in 2 weeks to see if you are tolerating metformin

## 2022-09-02 NOTE — Telephone Encounter (Signed)
Pt dropped off FMLA pwk at checkout to be completed by Dr. Durwin Nora for her diabetes.    Noted/copied/sleeved

## 2022-09-03 ENCOUNTER — Encounter: Payer: Self-pay | Admitting: Internal Medicine

## 2022-09-03 DIAGNOSIS — Z0279 Encounter for issue of other medical certificate: Secondary | ICD-10-CM

## 2022-09-03 NOTE — Assessment & Plan Note (Signed)
Patient has titrated up her Metformin to 2000 mg daily. She has a loose bowel movement 2 hours after each time she takes Metformin. This started with 500 mg dose. She is tolerating, but doesn't know how she could manage when returning to work. Her blood glucose is >250 on all morning checks. Blurry vision is improving, but not back to normal.   Assessment/Plan: Type 2 Diabetes Mellitus with hyperglycemia. Will try metformin XR . Instructions given to titrate up Lantus as follows: If blood glucose >180 increase Lantus by 4 units every 3 days . Follow up in 2 weeks to see if she is tolerating Metformin. Will discuss starting additional oral medications at this visit.  - metFORMIN (GLUMETZA) 1000 MG (MOD) 24 hr tablet; Take 1 tablet (1,000 mg total) by mouth 2 (two) times daily with a meal.  Dispense: 60 tablet; Refill: 1 - insulin glargine (LANTUS) 100 UNIT/ML Solostar Pen; Inject 20 Units into the skin at bedtime.  Dispense: 15 mL; Refill: 0

## 2022-09-09 ENCOUNTER — Other Ambulatory Visit: Payer: Self-pay

## 2022-09-09 MED ORDER — METFORMIN HCL 1000 MG PO TABS: 1000.0000 mg | ORAL_TABLET | Freq: Two times a day (BID) | ORAL | 3 refills | Status: DC

## 2022-09-16 ENCOUNTER — Encounter: Payer: Self-pay | Admitting: Internal Medicine

## 2022-09-16 ENCOUNTER — Ambulatory Visit (INDEPENDENT_AMBULATORY_CARE_PROVIDER_SITE_OTHER): Payer: Medicaid Other | Admitting: Internal Medicine

## 2022-09-16 VITALS — BP 137/89 | HR 88 | Ht 65.0 in | Wt 292.0 lb

## 2022-09-16 DIAGNOSIS — E1165 Type 2 diabetes mellitus with hyperglycemia: Secondary | ICD-10-CM | POA: Diagnosis not present

## 2022-09-16 MED ORDER — OZEMPIC (0.25 OR 0.5 MG/DOSE) 2 MG/3ML ~~LOC~~ SOPN: 0.2500 mL | PEN_INJECTOR | SUBCUTANEOUS | 0 refills | Status: DC

## 2022-09-16 NOTE — Assessment & Plan Note (Signed)
Patient is on metformin 2000 mg daily and tolerating medication.  She is on 25 units Lantus nightly.  Morning blood glucose for the past 7 days is 232 to 300.  Patients vision is almost back to normal. She continues to have some difficulty initially focusing on objects far away.   Assessment/plan: Type 2 diabetes mellitus with hyperglycemia -Continue metformin - Continue to titrate up Lantus as follows: If blood glucose >180 increase Lantus by 4 units every 3 days  - Start, Semaglutide,0.25 or 0.5MG /DOS, (OZEMPIC, 0.25 OR 0.5 MG/DOSE,) 2 MG/3ML SOPN; Inject 0.25 mLs into the skin once a week.  Dispense: 3 mL; Refill: 0 - Follow up in 4 weeks

## 2022-09-16 NOTE — Progress Notes (Signed)
     CC: Diabetes (Patient states she feels better since metformin was increased to 1000mg . Says her fasting range is between 210-250.)    HPI:[name] is a 31 y.o. female who presents for evaluation of type 2 diabetes mellitus. For the details of today's visit, please refer to the assessment and plan.   Past Medical History:  Diagnosis Date   Abnormal Pap smear    Asthma    Pregnant state, incidental    Urinary tract infection      Physical Exam: Vitals:   09/16/22 0939  BP: 137/89  Pulse: 88  SpO2: 98%  Weight: 292 lb (132.5 kg)  Height: 5\' 5"  (1.651 m)     Physical Exam Constitutional:      General: She is not in acute distress.    Appearance: She is not ill-appearing.  Eyes:     General: No scleral icterus.    Conjunctiva/sclera: Conjunctivae normal.  Cardiovascular:     Rate and Rhythm: Normal rate and regular rhythm.     Heart sounds: No murmur heard.    No friction rub.  Pulmonary:     Effort: Pulmonary effort is normal.     Breath sounds: No wheezing, rhonchi or rales.      Assessment & Plan:   Type 2 diabetes mellitus with hyperglycemia (HCC) Patient is on metformin 2000 mg daily and tolerating medication.  She is on 25 units Lantus nightly.  Morning blood glucose for the past 7 days is 232 to 300.  Patients vision is almost back to normal. She continues to have some difficulty initially focusing on objects far away.   Assessment/plan: Type 2 diabetes mellitus with hyperglycemia -Continue metformin - Continue to titrate up Lantus as follows: If blood glucose >180 increase Lantus by 4 units every 3 days  - Start, Semaglutide,0.25 or 0.5MG /DOS, (OZEMPIC, 0.25 OR 0.5 MG/DOSE,) 2 MG/3ML SOPN; Inject 0.25 mLs into the skin once a week.  Dispense: 3 mL; Refill: 0 - Follow up in 4 weeks     09/18/22, MD

## 2022-09-16 NOTE — Patient Instructions (Signed)
Thank you, [name] for allowing [place] to provide your care today. Today we discussed type 2 diabetes.    I have ordered the following labs for you:  Lab Orders  No laboratory test(s) ordered today     Tests ordered today:    Referrals ordered today:   Referral Orders  No referral(s) requested today     I have ordered the following medication/changed the following medications:   Stop the following medications: There are no discontinued medications.   Start the following medications: No orders of the defined types were placed in this encounter.    Follow up:  1 month       Remember: If blood glucose >180 increase Lantus by 4 units every 3 days . Start Ozempic and continue Metformin. Follow up in 1 month.     Thurmon Fair, M.D.

## 2022-09-29 ENCOUNTER — Telehealth: Payer: Self-pay | Admitting: Internal Medicine

## 2022-09-29 ENCOUNTER — Other Ambulatory Visit: Payer: Self-pay

## 2022-09-29 DIAGNOSIS — E1165 Type 2 diabetes mellitus with hyperglycemia: Secondary | ICD-10-CM

## 2022-09-29 MED ORDER — OZEMPIC (0.25 OR 0.5 MG/DOSE) 2 MG/3ML ~~LOC~~ SOPN: 0.2500 mg | PEN_INJECTOR | SUBCUTANEOUS | 0 refills | Status: DC

## 2022-09-29 NOTE — Telephone Encounter (Signed)
Ozempic has been approved until 09/29/23

## 2022-09-30 ENCOUNTER — Ambulatory Visit (INDEPENDENT_AMBULATORY_CARE_PROVIDER_SITE_OTHER): Payer: Medicaid Other | Admitting: Internal Medicine

## 2022-09-30 ENCOUNTER — Encounter: Payer: Self-pay | Admitting: Internal Medicine

## 2022-09-30 VITALS — BP 133/86 | HR 105 | Ht 65.0 in | Wt 281.8 lb

## 2022-09-30 DIAGNOSIS — Z124 Encounter for screening for malignant neoplasm of cervix: Secondary | ICD-10-CM

## 2022-09-30 DIAGNOSIS — E1159 Type 2 diabetes mellitus with other circulatory complications: Secondary | ICD-10-CM | POA: Diagnosis not present

## 2022-09-30 DIAGNOSIS — Z3046 Encounter for surveillance of implantable subdermal contraceptive: Secondary | ICD-10-CM | POA: Diagnosis not present

## 2022-09-30 DIAGNOSIS — I152 Hypertension secondary to endocrine disorders: Secondary | ICD-10-CM

## 2022-09-30 DIAGNOSIS — E1165 Type 2 diabetes mellitus with hyperglycemia: Secondary | ICD-10-CM | POA: Diagnosis not present

## 2022-09-30 MED ORDER — OLMESARTAN MEDOXOMIL 40 MG PO TABS: 40.0000 mg | ORAL_TABLET | Freq: Every day | ORAL | 1 refills | Status: DC

## 2022-09-30 NOTE — Patient Instructions (Signed)
It was a pleasure to see you today.  Thank you for giving [place] the opportunity to be involved in your care.  Below is a brief recap of your visit and next steps.  We will plan to see you again in 4 weeks.  Summary Increase olmesartan to 40 mg daily We will check your urine for protein today and plan for follow up in 4 weeks Goal AM blood sugar readings: 90-130

## 2022-09-30 NOTE — Progress Notes (Signed)
Established Patient Office Visit  Subjective   Patient ID: Marie Price, female    DOB: Feb 06, 1991  Age: 32 y.o. MRN: [id number]  Chief Complaint  Patient presents with   Hypertension   Diabetes    Follow up   Cough   Marie Price returns to care today.  She was last seen at [facility] on 09/16/2022 by Dr. Court Joy at which time Ozempic was started for treatment of diabetes.  She was also instructed to increase Lantus by 4 units every 3 days if her a.m. blood sugar readings remained above 180.  There have been no acute interval events.  Today Marie Price reports feeling well overall.  She endorses a nonproductive cough.  She is otherwise asymptomatic and has no acute concerns to discuss.  Past Medical History:  Diagnosis Date   Abnormal Pap smear    Asthma    Pregnant state, incidental    Urinary tract infection    Past Surgical History:  Procedure Laterality Date   CESAREAN SECTION  08/22/2011   Procedure: CESAREAN SECTION;  Surgeon: Clarene Duke, MD;  Location: [facility];  Service: Gynecology;  Laterality: N/A;  primary of baby girl  at 58  APGAR 9/9   NO PAST SURGERIES     Social History   Tobacco Use   Smoking status: Never   Smokeless tobacco: Never  Substance Use Topics   Alcohol use: No   Drug use: No   Family History  Problem Relation Age of Onset   Hypertension Mother    Diabetes Mother    Hypertension Father    Diabetes Father    Anesthesia problems Neg Hx    No Known Allergies  Review of Systems  Respiratory:  Positive for cough.   All other systems reviewed and are negative.    Objective:     BP 133/86   Pulse (!) 105   Ht 5\' 5"  (1.651 m)   Wt 281 lb 12.8 oz (127.8 kg)   SpO2 95%   BMI 46.89 kg/m  BP Readings from Last 3 Encounters:  09/30/22 133/86  09/16/22 137/89  09/02/22 138/88   Physical Exam Vitals reviewed.  Constitutional:      General: She is not in acute distress.    Appearance: Normal appearance. She is obese. She is not  toxic-appearing.  HENT:     Head: Normocephalic and atraumatic.     Right Ear: External ear normal.     Left Ear: External ear normal.     Nose: Nose normal. No congestion or rhinorrhea.     Mouth/Throat:     Mouth: Mucous membranes are moist.     Pharynx: Oropharynx is clear. No oropharyngeal exudate or posterior oropharyngeal erythema.  Eyes:     General: No scleral icterus.    Extraocular Movements: Extraocular movements intact.     Conjunctiva/sclera: Conjunctivae normal.     Pupils: Pupils are equal, round, and reactive to light.  Cardiovascular:     Rate and Rhythm: Normal rate and regular rhythm.     Pulses: Normal pulses.     Heart sounds: Normal heart sounds. No murmur heard.    No friction rub. No gallop.  Pulmonary:     Effort: Pulmonary effort is normal.     Breath sounds: Normal breath sounds. No wheezing, rhonchi or rales.  Abdominal:     General: Abdomen is flat. Bowel sounds are normal. There is no distension.     Palpations: Abdomen is soft.  Tenderness: There is no abdominal tenderness.  Musculoskeletal:        General: No swelling. Normal range of motion.     Cervical back: Normal range of motion.     Right lower leg: No edema.     Left lower leg: No edema.  Lymphadenopathy:     Cervical: No cervical adenopathy.  Skin:    General: Skin is warm and dry.     Capillary Refill: Capillary refill takes less than 2 seconds.     Coloration: Skin is not jaundiced.  Neurological:     General: No focal deficit present.     Mental Status: She is alert and oriented to person, place, and time.  Psychiatric:        Mood and Affect: Mood normal.        Behavior: Behavior normal.    Last CBC Lab Results  Component Value Date   WBC 7.6 08/22/2022   HGB 15.6 (H) 08/22/2022   HCT 45.6 08/22/2022   MCV 82.8 08/22/2022   MCH 28.3 08/22/2022   RDW 11.9 08/22/2022   PLT 271 31/51/7616   Last metabolic panel Lab Results  Component Value Date   GLUCOSE 674 (HH)  08/25/2022   NA 133 (L) 08/25/2022   K 4.9 08/25/2022   CL 95 (L) 08/25/2022   CO2 19 (L) 08/25/2022   BUN 10 08/25/2022   CREATININE 0.93 08/25/2022   EGFR 85 08/25/2022   CALCIUM 9.7 08/25/2022   PROT 7.8 08/22/2022   ALBUMIN 4.4 08/22/2022   BILITOT 0.6 08/22/2022   ALKPHOS 114 08/22/2022   AST 36 08/22/2022   ALT 43 08/22/2022   ANIONGAP 9 08/22/2022   Last hemoglobin A1c Lab Results  Component Value Date   HGBA1C 12.9 (A) 08/25/2022    Assessment & Plan:   Problem List Items Addressed This Visit       Hypertension associated with diabetes ([place])    Her initial blood pressure today was 133/86 and 144/96 on repeat.  She is currently prescribed olmesartan 20 mg daily. -Increase olmesartan to 40 mg daily -Follow-up in 4 weeks for HTN check      Type 2 diabetes mellitus with hyperglycemia (HCC)    A1c 12.6 in November.  She is currently prescribed metformin 1000 mg twice daily and Lantus 25 units nightly.  Ozempic was prescribed at her last appointment, however she has not been able to start medication yet.  She is currently checking her blood sugar each morning.  She reports a high reading of 169 and a low reading of 120. -No medication or insulin adjustments today.  She will start Ozempic soon.  We will tentatively plan for follow-up in 4 weeks to review blood sugar readings after starting Ozempic. -Urine microalbumin/creatinine ratio ordered today      Cervical cancer screening    She is due for Pap smear and needs to have her Nexplanon removed.  At her request I have placed referral to OB/GYN today.      Return in about 4 weeks (around 10/28/2022) for HTN, DM.    Johnette Abraham, MD

## 2022-10-02 ENCOUNTER — Other Ambulatory Visit: Payer: Self-pay | Admitting: Internal Medicine

## 2022-10-02 ENCOUNTER — Encounter: Payer: Self-pay | Admitting: Internal Medicine

## 2022-10-02 DIAGNOSIS — E1129 Type 2 diabetes mellitus with other diabetic kidney complication: Secondary | ICD-10-CM

## 2022-10-02 DIAGNOSIS — E1165 Type 2 diabetes mellitus with hyperglycemia: Secondary | ICD-10-CM

## 2022-10-02 LAB — MICROALBUMIN / CREATININE URINE RATIO
Creatinine, Urine: 494.2 mg/dL
Microalb/Creat Ratio: 204 mg/g creat — ABNORMAL HIGH (ref 0–29)
Microalbumin, Urine: 1008.8 ug/mL

## 2022-10-02 MED ORDER — DAPAGLIFLOZIN PROPANEDIOL 10 MG PO TABS: 10.0000 mg | ORAL_TABLET | Freq: Every day | ORAL | 2 refills | Status: DC

## 2022-10-05 DIAGNOSIS — Z124 Encounter for screening for malignant neoplasm of cervix: Secondary | ICD-10-CM | POA: Insufficient documentation

## 2022-10-05 NOTE — Assessment & Plan Note (Signed)
Her initial blood pressure today was 133/86 and 144/96 on repeat.  She is currently prescribed olmesartan 20 mg daily. -Increase olmesartan to 40 mg daily -Follow-up in 4 weeks for HTN check

## 2022-10-05 NOTE — Assessment & Plan Note (Addendum)
She is due for Pap smear and needs to have her Nexplanon removed.  At her request I have placed referral to OB/GYN today.

## 2022-10-05 NOTE — Assessment & Plan Note (Signed)
A1c 12.6 in November.  She is currently prescribed metformin 1000 mg twice daily and Lantus 25 units nightly.  Ozempic was prescribed at her last appointment, however she has not been able to start medication yet.  She is currently checking her blood sugar each morning.  She reports a high reading of 169 and a low reading of 120. -No medication or insulin adjustments today.  She will start Ozempic soon.  We will tentatively plan for follow-up in 4 weeks to review blood sugar readings after starting Ozempic. -Urine microalbumin/creatinine ratio ordered today

## 2022-10-07 ENCOUNTER — Encounter: Payer: Medicaid Other | Admitting: Nutrition

## 2022-10-14 ENCOUNTER — Encounter: Payer: Medicaid Other | Admitting: Skilled Nursing Facility1

## 2022-10-28 ENCOUNTER — Encounter: Payer: Self-pay | Admitting: Internal Medicine

## 2022-10-28 ENCOUNTER — Ambulatory Visit: Payer: Medicaid Other | Admitting: Internal Medicine

## 2022-11-23 ENCOUNTER — Other Ambulatory Visit: Payer: Self-pay

## 2022-11-23 DIAGNOSIS — E1165 Type 2 diabetes mellitus with hyperglycemia: Secondary | ICD-10-CM

## 2022-11-23 MED ORDER — OZEMPIC (0.25 OR 0.5 MG/DOSE) 2 MG/3ML ~~LOC~~ SOPN: 0.2500 mg | PEN_INJECTOR | SUBCUTANEOUS | 0 refills | Status: DC

## 2022-12-09 ENCOUNTER — Ambulatory Visit (INDEPENDENT_AMBULATORY_CARE_PROVIDER_SITE_OTHER): Payer: Medicaid Other | Admitting: Internal Medicine

## 2022-12-09 ENCOUNTER — Encounter: Payer: Self-pay | Admitting: Internal Medicine

## 2022-12-09 VITALS — BP 132/84 | HR 102 | Ht 65.0 in | Wt 268.2 lb

## 2022-12-09 DIAGNOSIS — R809 Proteinuria, unspecified: Secondary | ICD-10-CM

## 2022-12-09 DIAGNOSIS — E1159 Type 2 diabetes mellitus with other circulatory complications: Secondary | ICD-10-CM | POA: Diagnosis not present

## 2022-12-09 DIAGNOSIS — I152 Hypertension secondary to endocrine disorders: Secondary | ICD-10-CM

## 2022-12-09 DIAGNOSIS — E1129 Type 2 diabetes mellitus with other diabetic kidney complication: Secondary | ICD-10-CM

## 2022-12-09 DIAGNOSIS — E1165 Type 2 diabetes mellitus with hyperglycemia: Secondary | ICD-10-CM | POA: Diagnosis not present

## 2022-12-09 LAB — POCT GLYCOSYLATED HEMOGLOBIN (HGB A1C): HbA1c, POC (controlled diabetic range): 6.1 % (ref 0.0–7.0)

## 2022-12-09 MED ORDER — OZEMPIC (0.25 OR 0.5 MG/DOSE) 2 MG/3ML ~~LOC~~ SOPN: 0.5000 mg | PEN_INJECTOR | SUBCUTANEOUS | 0 refills | Status: DC

## 2022-12-09 MED ORDER — DAPAGLIFLOZIN PROPANEDIOL 10 MG PO TABS: 10.0000 mg | ORAL_TABLET | Freq: Every day | ORAL | 2 refills | Status: AC

## 2022-12-09 MED ORDER — BLOOD GLUCOSE TEST VI STRP: 1.0000 | ORAL_STRIP | Freq: Three times a day (TID) | 0 refills | Status: AC

## 2022-12-09 MED ORDER — INSULIN GLARGINE 100 UNIT/ML SOLOSTAR PEN: 20.0000 [IU] | PEN_INJECTOR | Freq: Every day | SUBCUTANEOUS | 0 refills | Status: DC

## 2022-12-09 NOTE — Patient Instructions (Signed)
It was a pleasure to see you today.  Thank you for giving [place] the opportunity to be involved in your care.  Below is a brief recap of your visit and next steps.  We will plan to see you again in 3 months.   Summary Increase Ozempic to 0.5 mg weekly for the next 4 weeks. Plan to increase to 1 mg weekly afterwards. I recommend decreasing Lantus by 4 units every 3-4 days if your morning blood sugar remains < 120. Follow up in 3 months

## 2022-12-09 NOTE — Assessment & Plan Note (Signed)
She is currently prescribed olmesartan 40 mg daily.  Her blood pressure today is 132/84. -No additional changes for now.  Continue olmesartan 40 mg daily

## 2022-12-09 NOTE — Assessment & Plan Note (Addendum)
Presenting today for DM follow-up.  She is currently prescribed metformin 1000 mg twice daily, Farxiga 10 mg daily, Lantus 20 units nightly, and Ozempic 0.25 mg weekly.  She checks her blood sugar each morning and has not had any readings > 120 recently.  Her A1c has improved to 6.1 from 12.9 previously. -She was congratulated on her significant progress in achieving glycemic control -Increase Ozempic to 0.5 mg weekly -Continue metformin and Farxiga at current doses -We discussed gradually decreasing Lantus.  She was instructed to reduce her evening Lantus by 4 units if her blood sugar remains < 120. -Diabetic foot exam completed today -Ophthalmology referral placed for routine diabetic eye care -We will tentatively plan for follow-up in 3 months

## 2022-12-09 NOTE — Progress Notes (Signed)
Established Patient Office Visit  Subjective   Patient ID: Marie Price, female    DOB: October 15, 1990  Age: 32 y.o. MRN: [id number]  Chief Complaint  Patient presents with   Diabetes    Follow up   Ms. Marie Price returns to care today for DM and HTN follow-up.  She was last seen by me on 1/3 at which time olmesartan was increased to 40 mg daily.  She was also planning to start Happy shortly after her appointment.  4-week follow-up was arranged.  In the interim, Wilder Glade was added in the setting of moderately increased microalbuminuria.  There have been no acute interval events.  Ms. Marie Price reports feeling well today.  She is asymptomatic and has no acute concerns to discuss.  Past Medical History:  Diagnosis Date   Abnormal Pap smear    Asthma    Pregnant state, incidental    Urinary tract infection    Past Surgical History:  Procedure Laterality Date   CESAREAN SECTION  08/22/2011   Procedure: CESAREAN SECTION;  Surgeon: Clarene Duke, MD;  Location: [facility];  Service: Gynecology;  Laterality: N/A;  primary of baby girl  at 32  APGAR 9/9   NO PAST SURGERIES     Social History   Tobacco Use   Smoking status: Never   Smokeless tobacco: Never  Substance Use Topics   Alcohol use: No   Drug use: No   Family History  Problem Relation Age of Onset   Hypertension Mother    Diabetes Mother    Hypertension Father    Diabetes Father    Anesthesia problems Neg Hx    No Known Allergies  Review of Systems  Constitutional:  Negative for chills and fever.  HENT:  Negative for sore throat.   Respiratory:  Negative for cough and shortness of breath.   Cardiovascular:  Negative for chest pain, palpitations and leg swelling.  Gastrointestinal:  Negative for abdominal pain, blood in stool, constipation, diarrhea, nausea and vomiting.  Genitourinary:  Negative for dysuria and hematuria.  Musculoskeletal:  Negative for myalgias.  Skin:  Negative for itching and rash.   Neurological:  Negative for dizziness and headaches.  Psychiatric/Behavioral:  Negative for depression and suicidal ideas.      Objective:     BP 132/84   Pulse (!) 102   Ht '5\' 5"'$  (1.651 m)   Wt 268 lb 3.2 oz (121.7 kg)   SpO2 95%   BMI 44.63 kg/m  BP Readings from Last 3 Encounters:  12/09/22 132/84  09/30/22 133/86  09/16/22 137/89   Physical Exam Vitals reviewed.  Constitutional:      General: She is not in acute distress.    Appearance: Normal appearance. She is obese. She is not toxic-appearing.  HENT:     Head: Normocephalic and atraumatic.     Right Ear: External ear normal.     Left Ear: External ear normal.     Nose: Nose normal. No congestion or rhinorrhea.     Mouth/Throat:     Mouth: Mucous membranes are moist.     Pharynx: Oropharynx is clear. No oropharyngeal exudate or posterior oropharyngeal erythema.  Eyes:     General: No scleral icterus.    Extraocular Movements: Extraocular movements intact.     Conjunctiva/sclera: Conjunctivae normal.     Pupils: Pupils are equal, round, and reactive to light.  Cardiovascular:     Rate and Rhythm: Normal rate and regular rhythm.     Pulses: Normal  pulses.     Heart sounds: Normal heart sounds. No murmur heard.    No friction rub. No gallop.  Pulmonary:     Effort: Pulmonary effort is normal.     Breath sounds: Normal breath sounds. No wheezing, rhonchi or rales.  Abdominal:     General: Abdomen is flat. Bowel sounds are normal. There is no distension.     Palpations: Abdomen is soft.     Tenderness: There is no abdominal tenderness.  Musculoskeletal:        General: No swelling. Normal range of motion.     Cervical back: Normal range of motion.     Right lower leg: No edema.     Left lower leg: No edema.  Lymphadenopathy:     Cervical: No cervical adenopathy.  Skin:    General: Skin is warm and dry.     Capillary Refill: Capillary refill takes less than 2 seconds.     Coloration: Skin is not jaundiced.   Neurological:     General: No focal deficit present.     Mental Status: She is alert and oriented to person, place, and time.  Psychiatric:        Mood and Affect: Mood normal.        Behavior: Behavior normal.    Diabetic foot exam was performed.  No deformities or other abnormal visual findings.  Posterior tibialis and dorsalis pulse intact bilaterally.  Intact to touch and monofilament testing bilaterally.    Last CBC Lab Results  Component Value Date   WBC 7.6 08/22/2022   HGB 15.6 (H) 08/22/2022   HCT 45.6 08/22/2022   MCV 82.8 08/22/2022   MCH 28.3 08/22/2022   RDW 11.9 08/22/2022   PLT 271 0000000   Last metabolic panel Lab Results  Component Value Date   GLUCOSE 674 (HH) 08/25/2022   NA 133 (L) 08/25/2022   K 4.9 08/25/2022   CL 95 (L) 08/25/2022   CO2 19 (L) 08/25/2022   BUN 10 08/25/2022   CREATININE 0.93 08/25/2022   EGFR 85 08/25/2022   CALCIUM 9.7 08/25/2022   PROT 7.8 08/22/2022   ALBUMIN 4.4 08/22/2022   BILITOT 0.6 08/22/2022   ALKPHOS 114 08/22/2022   AST 36 08/22/2022   ALT 43 08/22/2022   ANIONGAP 9 08/22/2022   Last hemoglobin A1c Lab Results  Component Value Date   HGBA1C 6.1 12/09/2022     Assessment & Plan:   Problem List Items Addressed This Visit       Hypertension associated with diabetes ([place])    She is currently prescribed olmesartan 40 mg daily.  Her blood pressure today is 132/84. -No additional changes for now.  Continue olmesartan 40 mg daily      Type 2 diabetes mellitus with hyperglycemia ([place]) - Primary    Presenting today for DM follow-up.  She is currently prescribed metformin 1000 mg twice daily, Farxiga 10 mg daily, Lantus 20 units nightly, and Ozempic 0.25 mg weekly.  She checks her blood sugar each morning and has not had any readings > 120 recently.  Her A1c has improved to 6.1 from 12.9 previously. -She was congratulated on her significant progress in achieving glycemic control -Increase Ozempic to 0.5 mg  weekly -Continue metformin and Farxiga at current doses -We discussed gradually decreasing Lantus.  She was instructed to reduce her evening Lantus by 4 units if her blood sugar remains < 120. -Diabetic foot exam completed today -Ophthalmology referral placed for routine diabetic eye care -  We will tentatively plan for follow-up in 3 months      Return in about 3 months (around 03/11/2023).   Johnette Abraham, MD

## 2022-12-10 ENCOUNTER — Other Ambulatory Visit: Payer: Self-pay

## 2023-01-13 ENCOUNTER — Other Ambulatory Visit: Payer: Self-pay | Admitting: Internal Medicine

## 2023-01-13 DIAGNOSIS — E1165 Type 2 diabetes mellitus with hyperglycemia: Secondary | ICD-10-CM

## 2023-02-04 ENCOUNTER — Telehealth: Payer: Self-pay | Admitting: Internal Medicine

## 2023-02-04 NOTE — Telephone Encounter (Signed)
Patient called ned med refill metFORMIN (GLUCOPHAGE) 1000 MG tablet [[id number]]   Pharmacy; [facility] [address]

## 2023-02-05 ENCOUNTER — Other Ambulatory Visit: Payer: Self-pay

## 2023-02-05 MED ORDER — METFORMIN HCL 1000 MG PO TABS: 1000.0000 mg | ORAL_TABLET | Freq: Two times a day (BID) | ORAL | 3 refills | Status: DC

## 2023-02-05 NOTE — Telephone Encounter (Signed)
Refill sent.

## 2023-02-08 ENCOUNTER — Telehealth (INDEPENDENT_AMBULATORY_CARE_PROVIDER_SITE_OTHER): Payer: Medicaid Other | Admitting: Internal Medicine

## 2023-02-08 ENCOUNTER — Encounter: Payer: Self-pay | Admitting: Internal Medicine

## 2023-02-08 DIAGNOSIS — J302 Other seasonal allergic rhinitis: Secondary | ICD-10-CM | POA: Insufficient documentation

## 2023-02-08 MED ORDER — FLUTICASONE PROPIONATE 50 MCG/ACT NA SUSP: NASAL | 0 refills | Status: DC

## 2023-02-08 NOTE — Progress Notes (Signed)
Virtual Visit via Video Note  I connected with Marie Price on 02/08/23 at  1:00 PM EDT by a video enabled telemedicine application and verified that I am speaking with the correct person using two identifiers.  Patient Location: Home Provider Location: Home Office  I discussed the limitations, risks, security, and privacy concerns of performing an evaluation and management service by video and the availability of in person appointments. I also discussed with the patient that there may be a patient responsible charge related to this service. The patient expressed understanding and agreed to proceed.  Subjective: PCP: Billie Lade, MD  Patient presents with chief complaint of  sneezing, swollen eyes , and occasional cough. She has history of seasonal allergies. She was concerned this morning when she woke up with face swollen. She tried afrin spray yesterday and has been taking Claritin to help with allergy symptoms. 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    ROS: Per HPI  Current Outpatient Medications:    blood glucose meter kit and supplies KIT, Dispense based on patient and insurance preference. Use up to four times daily as directed., Disp: 1 each, Rfl: 0   dapagliflozin propanediol (FARXIGA) 10 MG TABS tablet, Take 1 tablet (10 mg total) by mouth daily before breakfast., Disp: 30 tablet, Rfl: 2   etonogestrel (NEXPLANON) 68 MG IMPL implant, 1 each by Subdermal route once., Disp: , Rfl:    fluticasone (FLONASE) 50 MCG/ACT nasal spray, Place 2 sprays into both nostrils in the morning and at bedtime for 2 days, THEN 1 spray daily as needed for up to 28 days for allergies or rhinitis., Disp: 15.8 mL, Rfl: 0   insulin glargine (LANTUS) 100 UNIT/ML Solostar Pen, Inject 20 Units into the skin at bedtime., Disp: 15 mL, Rfl: 0   metFORMIN (GLUCOPHAGE) 1000 MG tablet, Take 1 tablet (1,000 mg total) by mouth 2 (two) times daily with a meal., Disp: 180 tablet, Rfl:  3   Misc Natural Products (ELDERBERRY IMMUNE COMPLEX) CHEW, Chew 1 tablet by mouth daily., Disp: , Rfl:    olmesartan (BENICAR) 40 MG tablet, Take 1 tablet (40 mg total) by mouth daily., Disp: 90 tablet, Rfl: 1   OZEMPIC, 0.25 OR 0.5 MG/DOSE, 2 MG/3ML SOPN, INJECT 0.5 MG SUBCUTANEOUSLY ONCE A WEEK, Disp: 3 mL, Rfl: 0   Pediatric Multiple Vitamins (CHILDRENS MULTIVITAMIN) chewable tablet, Chew 1 tablet by mouth daily., Disp: , Rfl:    Vitamin D, Cholecalciferol, 10 MCG (400 UNIT) CHEW, Chew 1 tablet by mouth daily., Disp: , Rfl:    Zinc Sulfate (ZINCATE PO), Take 1 tablet by mouth daily., Disp: , Rfl:   Observations/Objective: There were no vitals filed for this visit.   Assessment and Plan: Seasonal allergic rhinitis, unspecified trigger Assessment & Plan: Continue Claritin Start nasal rinses, you can purchase Neti pot at any drug store  Fluticasone as prescribed Follow up if symptoms worsen or fail to improve  Orders: -     Fluticasone Propionate; Place 2 sprays into both nostrils in the morning and at bedtime for 2 days, THEN 1 spray daily as needed for up to 28 days for allergies or rhinitis.  Dispense: 15.8 mL; Refill: 0    Follow Up Instructions: Return if symptoms worsen or fail to improve.   I discussed the assessment and treatment plan with the patient. The patient was provided an opportunity to ask questions, and all were answered. The patient agreed with the plan and  demonstrated an understanding of the instructions.   The patient was advised to call back or seek an in-person evaluation if the symptoms worsen or if the condition fails to improve as anticipated.  The above assessment and management plan was discussed with the patient. The patient verbalized understanding of and has agreed to the management plan.   Milus Banister, MD

## 2023-02-08 NOTE — Patient Instructions (Signed)
Thank you, [name] for allowing [place] to provide your care today.   Continue Claritin Start nasal rinses, you can purchase Neti pot at any drug store  Start Nasal spray Follow up if symptoms worsen or fail to improve    Thurmon Fair, M.D.

## 2023-02-08 NOTE — Assessment & Plan Note (Addendum)
Continue Claritin Start nasal rinses, you can purchase Neti pot at any drug store  Fluticasone as prescribed Follow up if symptoms worsen or fail to improve Work excuse sent via [employer]

## 2023-02-09 ENCOUNTER — Other Ambulatory Visit: Payer: Self-pay | Admitting: Internal Medicine

## 2023-02-09 DIAGNOSIS — E1165 Type 2 diabetes mellitus with hyperglycemia: Secondary | ICD-10-CM

## 2023-02-14 ENCOUNTER — Other Ambulatory Visit: Payer: Self-pay | Admitting: Internal Medicine

## 2023-02-14 DIAGNOSIS — E1165 Type 2 diabetes mellitus with hyperglycemia: Secondary | ICD-10-CM

## 2023-02-24 ENCOUNTER — Encounter: Payer: Self-pay | Admitting: Internal Medicine

## 2023-02-24 ENCOUNTER — Ambulatory Visit (INDEPENDENT_AMBULATORY_CARE_PROVIDER_SITE_OTHER): Payer: Medicaid Other | Admitting: Internal Medicine

## 2023-02-24 VITALS — BP 168/116 | HR 116 | Ht 65.0 in | Wt 279.0 lb

## 2023-02-24 DIAGNOSIS — E1165 Type 2 diabetes mellitus with hyperglycemia: Secondary | ICD-10-CM

## 2023-02-24 DIAGNOSIS — Z7985 Long-term (current) use of injectable non-insulin antidiabetic drugs: Secondary | ICD-10-CM | POA: Diagnosis not present

## 2023-02-24 DIAGNOSIS — J302 Other seasonal allergic rhinitis: Secondary | ICD-10-CM

## 2023-02-24 MED ORDER — CETIRIZINE HCL 10 MG PO TABS
10.0000 mg | ORAL_TABLET | Freq: Every day | ORAL | 1 refills | Status: AC
Start: 2023-02-24 — End: ?

## 2023-02-24 MED ORDER — AZELASTINE-FLUTICASONE 137-50 MCG/ACT NA SUSP: 2.0000 | Freq: Two times a day (BID) | NASAL | 0 refills | Status: DC

## 2023-02-24 MED ORDER — OZEMPIC (0.25 OR 0.5 MG/DOSE) 2 MG/3ML ~~LOC~~ SOPN: PEN_INJECTOR | SUBCUTANEOUS | 0 refills | Status: DC

## 2023-02-24 NOTE — Patient Instructions (Addendum)
Thank you, Ms.Adreona Peerson for allowing [place] to provide your care today.   Type 2 diabetes mellitus with hyperglycemia, without long-term current use of insulin (HCC)  - Semaglutide,0.25 or 0.5MG /DOS, (OZEMPIC, 0.25 OR 0.5 MG/DOSE,) 2 MG/3ML SOPN; INJECT 0.5MG  INTO THE SKIN ONCE A WEEK  Dispense: 3 mL; Refill: 0  Seasonal allergic rhinitis, unspecified trigger  - Azelastine-Fluticasone (DYMISTA) 137-50 MCG/ACT SUSP; Place 2 sprays into the nose every 12 (twelve) hours.  Dispense: 23 g; Refill: 0 - cetirizine (ZYRTEC ALLERGY) 10 MG tablet; Take 1 tablet (10 mg total) by mouth daily.  Dispense: 60 tablet; Refill: 1   Reminders: Follow up already scheduled.     Thurmon Fair, M.D.

## 2023-02-24 NOTE — Progress Notes (Unsigned)
   HPI:Marie Price is a 32 y.o. female who presents for evaluation of Cough (Patient has a bad cough. She feels its her allergies , she was seen on 02/08/2023. She is also needing a refill on ozempic) . For the details of today's visit, please refer to the assessment and plan.  Physical Exam: Vitals:   02/24/23 1355  BP: (!) 168/116  Pulse: (!) 116  SpO2: 96%  Weight: 279 lb (126.6 kg)  Height: 5\' 5"  (1.651 m)     Physical Exam Vitals reviewed.  Constitutional:      General: She is not in acute distress.    Appearance: She is not ill-appearing.  HENT:     Mouth/Throat:     Mouth: Mucous membranes are moist.     Pharynx: No oropharyngeal exudate or posterior oropharyngeal erythema.  Cardiovascular:     Rate and Rhythm: Normal rate and regular rhythm.     Heart sounds: No murmur heard.    Comments: Improved on my exam, tachycardic when vital signs taken Pulmonary:     Effort: Pulmonary effort is normal.     Breath sounds: No wheezing, rhonchi or rales.      Assessment & Plan:   [name] was seen today for cough.  Seasonal allergic rhinitis, unspecified trigger Assessment & Plan: Patient has history of seasonal allergies and asthma as a child. Seen on 5/13 and prescribed nasal spray. 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 Symptoms have not improved. No fever or malaise.  Start Cetrizine daily Recommend nasal rinsese, followed by Dymista nasal spray  Note provided to request if possible to  let patient switch work trucks.   Addendum:  Patient called and had fever at home. Cough is worsening. Will send antibiotic for possible bronchitis.   Orders: -     Azelastine-Fluticasone; Place 2 sprays into the nose every 12 (twelve) hours.  Dispense: 23 g; Refill: 0 -     Cetirizine HCl; Take 1 tablet (10 mg total) by mouth daily.  Dispense: 60 tablet; Refill: 1  Type 2 diabetes mellitus with hyperglycemia, without  long-term current use of insulin (HCC) -     Ozempic (0.25 or 0.5 MG/DOSE); INJECT 0.5MG  INTO THE SKIN ONCE A WEEK  Dispense: 3 mL; Refill: 0      Milus Banister, MD

## 2023-02-25 ENCOUNTER — Telehealth: Payer: Self-pay | Admitting: Internal Medicine

## 2023-02-25 DIAGNOSIS — J40 Bronchitis, not specified as acute or chronic: Secondary | ICD-10-CM

## 2023-02-25 MED ORDER — AMOXICILLIN-POT CLAVULANATE 875-125 MG PO TABS
1.0000 | ORAL_TABLET | Freq: Two times a day (BID) | ORAL | 0 refills | Status: DC
Start: 2023-02-25 — End: 2023-04-21

## 2023-02-25 NOTE — Telephone Encounter (Signed)
Called patient. She had fever last night. Continues to have cough and her throat is sore.No shortness of breath. The pharmacy received prescriptions, but she is waiting on them to fill the prescriptions for allergies. I will send antibiotic. Recommended follow up if not improving in a few days for evaluation and chest xray.

## 2023-02-25 NOTE — Assessment & Plan Note (Signed)
Patient has history of seasonal allergies and asthma as a child. Seen on 5/13 and prescribed nasal spray. 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 Symptoms have not improved. No fever or malaise.  Start Cetrizine daily Recommend nasal rinsese, followed by Dymista nasal spray  Note provided to request if possible to  let patient switch work trucks.   Addendum:  Patient called and had fever at home. Cough is worsening. Will send antibiotic for possible bronchitis.

## 2023-02-25 NOTE — Telephone Encounter (Signed)
Pt had an appt 02/24/23 she stated she does not feel better and her cough is worse, she also has a temp. Stated the phar never received prescription for cough med.

## 2023-02-25 NOTE — Assessment & Plan Note (Signed)
Patient needed Ozempic refill sent to [facility] in [place]. Refill sent. Preferred pharmacy updated

## 2023-03-17 ENCOUNTER — Ambulatory Visit: Payer: Medicaid Other | Admitting: Internal Medicine

## 2023-04-21 ENCOUNTER — Ambulatory Visit: Payer: Medicaid Other | Admitting: Internal Medicine

## 2023-04-21 ENCOUNTER — Encounter: Payer: Self-pay | Admitting: Internal Medicine

## 2023-04-21 VITALS — BP 116/81 | HR 92 | Ht 65.0 in | Wt 278.8 lb

## 2023-04-21 DIAGNOSIS — E1165 Type 2 diabetes mellitus with hyperglycemia: Secondary | ICD-10-CM

## 2023-04-21 DIAGNOSIS — I152 Hypertension secondary to endocrine disorders: Secondary | ICD-10-CM

## 2023-04-21 DIAGNOSIS — E1159 Type 2 diabetes mellitus with other circulatory complications: Secondary | ICD-10-CM

## 2023-04-21 MED ORDER — METFORMIN HCL 1000 MG PO TABS: 1000.0000 mg | ORAL_TABLET | Freq: Two times a day (BID) | ORAL | 3 refills | Status: DC

## 2023-04-21 MED ORDER — OLMESARTAN MEDOXOMIL 40 MG PO TABS
40.0000 mg | ORAL_TABLET | Freq: Every day | ORAL | 1 refills | Status: DC
Start: 2023-04-21 — End: 2023-07-21

## 2023-04-21 MED ORDER — SEMAGLUTIDE (1 MG/DOSE) 4 MG/3ML ~~LOC~~ SOPN
1.0000 mg | PEN_INJECTOR | SUBCUTANEOUS | 2 refills | Status: DC
Start: 2023-04-21 — End: 2023-07-23

## 2023-04-21 NOTE — Assessment & Plan Note (Signed)
A1c 6.1 on labs from March.  She has titrated herself completely off of Lantus.  She is currently taking Ozempic 0.5 mg weekly, metformin 1000 mg twice daily, and Farxiga 10 mg daily.  She regularly checks her blood sugar and reports that readings are consistently around 90. -Through shared decision making, Ozempic has been increased to 1 mg daily.  This should improve her ability to lose weight.  We discussed gradually titrating off of metformin if blood sugars remain within goal. -We will repeat labs at follow-up in 3 months.

## 2023-04-21 NOTE — Assessment & Plan Note (Signed)
BP remains adequately controlled on olmesartan 40 mg daily.  No medication changes are indicated today.  Olmesartan has been refilled.

## 2023-04-21 NOTE — Progress Notes (Signed)
Established Patient Office Visit  Subjective   Patient ID: Marie Price, female    DOB: [birth year]  Age: 32 y.o. MRN: [id number]  Chief Complaint  Patient presents with   Diabetes    Follow up   Marie Price returns to care today for routine follow-up.  She was last evaluated by me on 3/13.  At that time Ozempic was increased to 0.5 mg weekly.  We also discussed gradually decreasing Lantus as blood sugars remained within goal.  There have been no acute interval events.  Marie Price reports feeling well today.  She is asymptomatic and has no acute concerns to discuss.  She reports that she has been able to titrate herself completely off of Lantus.  Past Medical History:  Diagnosis Date   Abnormal Pap smear    Asthma    Pregnant state, incidental    Urinary tract infection    Past Surgical History:  Procedure Laterality Date   CESAREAN SECTION  08/22/2011   Procedure: CESAREAN SECTION;  Surgeon: Zenaida Niece, MD;  Location: [facility];  Service: Gynecology;  Laterality: N/A;  primary of baby girl  at 45  APGAR 9/9   NO PAST SURGERIES     Social History   Tobacco Use   Smoking status: Never   Smokeless tobacco: Never  Substance Use Topics   Alcohol use: No   Drug use: No   Family History  Problem Relation Age of Onset   Hypertension Mother    Diabetes Mother    Hypertension Father    Diabetes Father    Anesthesia problems Neg Hx    No Known Allergies  Review of Systems  Constitutional:  Negative for chills and fever.  HENT:  Negative for sore throat.   Respiratory:  Negative for cough and shortness of breath.   Cardiovascular:  Negative for chest pain, palpitations and leg swelling.  Gastrointestinal:  Negative for abdominal pain, blood in stool, constipation, diarrhea, nausea and vomiting.  Genitourinary:  Negative for dysuria and hematuria.  Musculoskeletal:  Negative for myalgias.  Skin:  Negative for itching and rash.  Neurological:  Negative for  dizziness and headaches.  Psychiatric/Behavioral:  Negative for depression and suicidal ideas.      Objective:     BP 116/81   Pulse 92   Ht 5\' 5"  (1.651 m)   Wt 278 lb 12.8 oz (126.5 kg)   SpO2 97%   BMI 46.39 kg/m  BP Readings from Last 3 Encounters:  04/21/23 116/81  02/24/23 (!) 168/116  12/09/22 132/84   Physical Exam Vitals reviewed.  Constitutional:      General: She is not in acute distress.    Appearance: Normal appearance. She is obese. She is not toxic-appearing.  HENT:     Head: Normocephalic and atraumatic.     Right Ear: External ear normal.     Left Ear: External ear normal.     Nose: Nose normal. No congestion or rhinorrhea.     Mouth/Throat:     Mouth: Mucous membranes are moist.     Pharynx: Oropharynx is clear. No oropharyngeal exudate or posterior oropharyngeal erythema.  Eyes:     General: No scleral icterus.    Extraocular Movements: Extraocular movements intact.     Conjunctiva/sclera: Conjunctivae normal.     Pupils: Pupils are equal, round, and reactive to light.  Cardiovascular:     Rate and Rhythm: Normal rate and regular rhythm.     Pulses: Normal pulses.  Heart sounds: Normal heart sounds. No murmur heard.    No friction rub. No gallop.  Pulmonary:     Effort: Pulmonary effort is normal.     Breath sounds: Normal breath sounds. No wheezing, rhonchi or rales.  Abdominal:     General: Abdomen is flat. Bowel sounds are normal. There is no distension.     Palpations: Abdomen is soft.     Tenderness: There is no abdominal tenderness.  Musculoskeletal:        General: No swelling. Normal range of motion.     Cervical back: Normal range of motion.     Right lower leg: No edema.     Left lower leg: No edema.  Lymphadenopathy:     Cervical: No cervical adenopathy.  Skin:    General: Skin is warm and dry.     Capillary Refill: Capillary refill takes less than 2 seconds.     Coloration: Skin is not jaundiced.  Neurological:      General: No focal deficit present.     Mental Status: She is alert and oriented to person, place, and time.  Psychiatric:        Mood and Affect: Mood normal.        Behavior: Behavior normal.   Last CBC Lab Results  Component Value Date   WBC 7.6 08/22/2022   HGB 15.6 (H) 08/22/2022   HCT 45.6 08/22/2022   MCV 82.8 08/22/2022   MCH 28.3 08/22/2022   RDW 11.9 08/22/2022   PLT 271 08/22/2022   Last metabolic panel Lab Results  Component Value Date   GLUCOSE 674 (HH) 08/25/2022   NA 133 (L) 08/25/2022   K 4.9 08/25/2022   CL 95 (L) 08/25/2022   CO2 19 (L) 08/25/2022   BUN 10 08/25/2022   CREATININE 0.93 08/25/2022   EGFR 85 08/25/2022   CALCIUM 9.7 08/25/2022   PROT 7.8 08/22/2022   ALBUMIN 4.4 08/22/2022   BILITOT 0.6 08/22/2022   ALKPHOS 114 08/22/2022   AST 36 08/22/2022   ALT 43 08/22/2022   ANIONGAP 9 08/22/2022   Last hemoglobin A1c Lab Results  Component Value Date   HGBA1C 6.1 12/09/2022    Assessment & Plan:   Problem List Items Addressed This Visit       Hypertension associated with diabetes (HCC)    BP remains adequately controlled on olmesartan 40 mg daily.  No medication changes are indicated today.  Olmesartan has been refilled.      Type 2 diabetes mellitus with hyperglycemia (HCC) - Primary    A1c 6.1 on labs from March.  She has titrated herself completely off of Lantus.  She is currently taking Ozempic 0.5 mg weekly, metformin 1000 mg twice daily, and Farxiga 10 mg daily.  She regularly checks her blood sugar and reports that readings are consistently around 90. -Through shared decision making, Ozempic has been increased to 1 mg daily.  This should improve her ability to lose weight.  We discussed gradually titrating off of metformin if blood sugars remain within goal. -We will repeat labs at follow-up in 3 months.      Return in about 3 months (around 07/22/2023).   Billie Lade, MD

## 2023-04-21 NOTE — Patient Instructions (Signed)
It was a pleasure to see you today.  Thank you for giving [place] the opportunity to be involved in your care.  Below is a brief recap of your visit and next steps.  We will plan to see you again in 3 months.  Summary Increase Ozempic to 1 mg weekly Ok to reduce metformin  Olmesartan refilled Follow up in 3 months

## 2023-06-02 ENCOUNTER — Encounter: Payer: Medicaid Other | Admitting: Obstetrics and Gynecology

## 2023-06-30 ENCOUNTER — Encounter: Payer: Self-pay | Admitting: Obstetrics and Gynecology

## 2023-06-30 ENCOUNTER — Ambulatory Visit (INDEPENDENT_AMBULATORY_CARE_PROVIDER_SITE_OTHER): Payer: Medicaid Other | Admitting: Obstetrics and Gynecology

## 2023-06-30 VITALS — BP 136/81 | HR 98 | Resp 16 | Ht 65.0 in | Wt 278.0 lb

## 2023-06-30 DIAGNOSIS — Z3046 Encounter for surveillance of implantable subdermal contraceptive: Secondary | ICD-10-CM | POA: Diagnosis not present

## 2023-06-30 DIAGNOSIS — Z3202 Encounter for pregnancy test, result negative: Secondary | ICD-10-CM

## 2023-06-30 DIAGNOSIS — Z30017 Encounter for initial prescription of implantable subdermal contraceptive: Secondary | ICD-10-CM

## 2023-06-30 LAB — POCT URINE PREGNANCY: Preg Test, Ur: NEGATIVE

## 2023-06-30 MED ORDER — ETONOGESTREL 68 MG ~~LOC~~ IMPL
68.0000 mg | DRUG_IMPLANT | Freq: Once | SUBCUTANEOUS | Status: AC
Start: 2023-06-30 — End: 2023-06-30
  Administered 2023-06-30: 68 mg via SUBCUTANEOUS

## 2023-06-30 NOTE — Progress Notes (Signed)
     GYNECOLOGY OFFICE PROCEDURE NOTE  Marie Price is a 32 y.o. G1P1001 here for Nexplanon removal and insertion.  Last pap smear was on 2018 and was normal.  No other gynecologic concerns.  Nexplanon Removal and Reinsertion Patient identified, informed consent performed, consent signed.   Patient does understand that irregular bleeding is a very common side effect of this medication. She was advised to have backup contraception for one week after replacement of the implant.   Pregnancy test in clinic today was negative.  Appropriate time out taken. Nexplanon site identified in left arm.  Area prepped in usual sterile fashon. One ml of 1% lidocaine was used to anesthetize the area at the distal end of the implant. A small stab incision was made right beside the implant on the distal portion. The Nexplanon rod was grasped using hemostats and removed without difficulty. There was minimal blood loss. There were no complications. Area was then injected with 3 ml of 1 % lidocaine. She was re-prepped with betadine, Nexplanon removed from packaging, Device confirmed in needle, then inserted full length of needle and withdrawn per handbook instructions. Nexplanon was able to palpated in the patient's arm; patient palpated the insert herself.  There was minimal blood loss. Patient insertion site covered with gauze and a pressure bandage to reduce any bruising.   The patient tolerated the procedure well and was given post procedure instructions.    She has not had a pap smear in some years. She will come back for annual exam. 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 We will obtain records from [facility] in [place].   Milas Hock, MD, FACOG Obstetrician & Gynecologist, [facility], [facility]

## 2023-07-12 ENCOUNTER — Ambulatory Visit: Payer: Medicaid Other | Admitting: Family Medicine

## 2023-07-14 ENCOUNTER — Encounter: Payer: Self-pay | Admitting: Internal Medicine

## 2023-07-14 ENCOUNTER — Telehealth: Payer: Self-pay | Admitting: Internal Medicine

## 2023-07-14 NOTE — Telephone Encounter (Signed)
FMLA  Noted  Copied Sleeved  Original in PCP box Copy front desk folder

## 2023-07-21 ENCOUNTER — Ambulatory Visit: Payer: Medicaid Other | Admitting: Internal Medicine

## 2023-07-21 ENCOUNTER — Encounter: Payer: Self-pay | Admitting: Internal Medicine

## 2023-07-21 VITALS — BP 135/83 | HR 109 | Ht 65.0 in | Wt 279.2 lb

## 2023-07-21 DIAGNOSIS — Z2821 Immunization not carried out because of patient refusal: Secondary | ICD-10-CM | POA: Diagnosis not present

## 2023-07-21 DIAGNOSIS — I152 Hypertension secondary to endocrine disorders: Secondary | ICD-10-CM

## 2023-07-21 DIAGNOSIS — E1159 Type 2 diabetes mellitus with other circulatory complications: Secondary | ICD-10-CM | POA: Diagnosis not present

## 2023-07-21 DIAGNOSIS — E1165 Type 2 diabetes mellitus with hyperglycemia: Secondary | ICD-10-CM

## 2023-07-21 DIAGNOSIS — Z7985 Long-term (current) use of injectable non-insulin antidiabetic drugs: Secondary | ICD-10-CM

## 2023-07-21 MED ORDER — METFORMIN HCL 1000 MG PO TABS: 500.0000 mg | ORAL_TABLET | Freq: Two times a day (BID) | ORAL | Status: DC

## 2023-07-21 MED ORDER — OLMESARTAN MEDOXOMIL 40 MG PO TABS: 40.0000 mg | ORAL_TABLET | Freq: Every day | ORAL | 1 refills | Status: DC

## 2023-07-21 NOTE — Assessment & Plan Note (Signed)
Remains adequately controlled with olmesartan 40 mg daily.  No medication changes are indicated today.

## 2023-07-21 NOTE — Assessment & Plan Note (Signed)
A1c 6.1 on labs from March.  She is currently prescribed Ozempic 1 mg weekly, Farxiga 10 mg daily, and metformin 1000 mg twice daily.  We discussed gradually titrating off metformin if blood sugars remained within goal.  She reports that she has reduced her dose to 500 mg twice daily.  She checks her blood sugar most mornings and reports readings consistently below 120. -Repeat A1c ordered today -Marcelline Deist was started in the setting of microalbuminuria when diabetes was poorly controlled.  Currently on ARB.  Through shared decision making, Marcelline Deist will not be refilled when she runs out of her current supply.  Repeat urine study at follow-up in 6 months and can resume SGLT2 therapy if microalbuminuria has not improved.  She will continue Ozempic 1 mg weekly and metformin at 500 mg twice daily.

## 2023-07-21 NOTE — Patient Instructions (Signed)
It was a pleasure to see you today.  Thank you for giving [place] the opportunity to be involved in your care.  Below is a brief recap of your visit and next steps.  We will plan to see you again in 6 months.  Summary No medication changes today Repeat labs ordered Follow up in 6 months

## 2023-07-21 NOTE — Progress Notes (Signed)
Established Patient Office Visit  Subjective   Patient ID: Marie Price, female    DOB: 08-Sep-1991  Age: 32 y.o. MRN: [id number]  Chief Complaint  Patient presents with   Diabetes    Three month follow up    Marie Price returns to care today for routine follow-up.  She was last evaluated by me on 7/24.  Ozempic was increased to 1 mg weekly at that time.  We discussed gradually titrating off metformin if blood sugars remained within goal.  10-month follow-up was arranged.  In the interim, she has been seen by gynecology for follow-up.  There have otherwise been no acute interval events.  Marie Price reports feeling well today.  She is asymptomatic and has no acute concerns to discuss.  Past Medical History:  Diagnosis Date   Abnormal Pap smear    Asthma    Pregnant state, incidental    Urinary tract infection    Past Surgical History:  Procedure Laterality Date   CESAREAN SECTION  08/22/2011   Procedure: CESAREAN SECTION;  Surgeon: Zenaida Niece, MD;  Location: [facility];  Service: Gynecology;  Laterality: N/A;  primary of baby girl  at 60  APGAR 9/9   NO PAST SURGERIES     Social History   Tobacco Use   Smoking status: Never   Smokeless tobacco: Never  Substance Use Topics   Alcohol use: No   Drug use: No   Family History  Problem Relation Age of Onset   Hypertension Mother    Diabetes Mother    Hypertension Father    Diabetes Father    Anesthesia problems Neg Hx    No Known Allergies  Review of Systems  Constitutional:  Negative for chills and fever.  HENT:  Negative for sore throat.   Respiratory:  Negative for cough and shortness of breath.   Cardiovascular:  Negative for chest pain, palpitations and leg swelling.  Gastrointestinal:  Negative for abdominal pain, blood in stool, constipation, diarrhea, nausea and vomiting.  Genitourinary:  Negative for dysuria and hematuria.  Musculoskeletal:  Negative for myalgias.  Skin:  Negative for itching and rash.   Neurological:  Negative for dizziness and headaches.  Psychiatric/Behavioral:  Negative for depression and suicidal ideas.      Objective:     BP 135/83 (BP Location: Left Arm, Patient Position: Sitting, Cuff Size: Large)   Pulse (!) 109   Ht 5\' 5"  (1.651 m)   Wt 279 lb 3.2 oz (126.6 kg)   SpO2 94%   BMI 46.46 kg/m  BP Readings from Last 3 Encounters:  07/21/23 135/83  06/30/23 136/81  04/21/23 116/81   Physical Exam Vitals reviewed.  Constitutional:      General: She is not in acute distress.    Appearance: Normal appearance. She is obese. She is not toxic-appearing.  HENT:     Head: Normocephalic and atraumatic.     Right Ear: External ear normal.     Left Ear: External ear normal.     Nose: Nose normal. No congestion or rhinorrhea.     Mouth/Throat:     Mouth: Mucous membranes are moist.     Pharynx: Oropharynx is clear. No oropharyngeal exudate or posterior oropharyngeal erythema.  Eyes:     General: No scleral icterus.    Extraocular Movements: Extraocular movements intact.     Conjunctiva/sclera: Conjunctivae normal.     Pupils: Pupils are equal, round, and reactive to light.  Cardiovascular:     Rate and  Rhythm: Normal rate and regular rhythm.     Pulses: Normal pulses.     Heart sounds: Normal heart sounds. No murmur heard.    No friction rub. No gallop.  Pulmonary:     Effort: Pulmonary effort is normal.     Breath sounds: Normal breath sounds. No wheezing, rhonchi or rales.  Abdominal:     General: Abdomen is flat. Bowel sounds are normal. There is no distension.     Palpations: Abdomen is soft.     Tenderness: There is no abdominal tenderness.  Musculoskeletal:        General: No swelling. Normal range of motion.     Cervical back: Normal range of motion.     Right lower leg: No edema.     Left lower leg: No edema.  Lymphadenopathy:     Cervical: No cervical adenopathy.  Skin:    General: Skin is warm and dry.     Capillary Refill: Capillary  refill takes less than 2 seconds.     Coloration: Skin is not jaundiced.  Neurological:     General: No focal deficit present.     Mental Status: She is alert and oriented to person, place, and time.  Psychiatric:        Mood and Affect: Mood normal.        Behavior: Behavior normal.   Last CBC Lab Results  Component Value Date   WBC 7.6 08/22/2022   HGB 15.6 (H) 08/22/2022   HCT 45.6 08/22/2022   MCV 82.8 08/22/2022   MCH 28.3 08/22/2022   RDW 11.9 08/22/2022   PLT 271 08/22/2022   Last metabolic panel Lab Results  Component Value Date   GLUCOSE 674 (HH) 08/25/2022   NA 133 (L) 08/25/2022   K 4.9 08/25/2022   CL 95 (L) 08/25/2022   CO2 19 (L) 08/25/2022   BUN 10 08/25/2022   CREATININE 0.93 08/25/2022   EGFR 85 08/25/2022   CALCIUM 9.7 08/25/2022   PROT 7.8 08/22/2022   ALBUMIN 4.4 08/22/2022   BILITOT 0.6 08/22/2022   ALKPHOS 114 08/22/2022   AST 36 08/22/2022   ALT 43 08/22/2022   ANIONGAP 9 08/22/2022   Last hemoglobin A1c Lab Results  Component Value Date   HGBA1C 6.1 12/09/2022     Assessment & Plan:   Problem List Items Addressed This Visit       Hypertension associated with diabetes (HCC)    Remains adequately controlled with olmesartan 40 mg daily.  No medication changes are indicated today.      Type 2 diabetes mellitus with hyperglycemia (HCC) - Primary    A1c 6.1 on labs from March.  She is currently prescribed Ozempic 1 mg weekly, Farxiga 10 mg daily, and metformin 1000 mg twice daily.  We discussed gradually titrating off metformin if blood sugars remained within goal.  She reports that she has reduced her dose to 500 mg twice daily.  She checks her blood sugar most mornings and reports readings consistently below 120. -Repeat A1c ordered today -Marcelline Deist was started in the setting of microalbuminuria when diabetes was poorly controlled.  Currently on ARB.  Through shared decision making, Marcelline Deist will not be refilled when she runs out of her  current supply.  Repeat urine study at follow-up in 6 months and can resume SGLT2 therapy if microalbuminuria has not improved.  She will continue Ozempic 1 mg weekly and metformin at 500 mg twice daily.      Return in about 6  months (around 01/19/2024).   Billie Lade, MD

## 2023-07-22 LAB — CBC WITH DIFFERENTIAL/PLATELET
Basophils Absolute: 0 10*3/uL (ref 0.0–0.2)
Basos: 0 %
EOS (ABSOLUTE): 0.4 10*3/uL (ref 0.0–0.4)
Eos: 4 %
Hematocrit: 44.9 % (ref 34.0–46.6)
Hemoglobin: 14.2 g/dL (ref 11.1–15.9)
Immature Grans (Abs): 0 10*3/uL (ref 0.0–0.1)
Immature Granulocytes: 0 %
Lymphocytes Absolute: 3.7 10*3/uL — ABNORMAL HIGH (ref 0.7–3.1)
Lymphs: 36 %
MCH: 28.1 pg (ref 26.6–33.0)
MCHC: 31.6 g/dL (ref 31.5–35.7)
MCV: 89 fL (ref 79–97)
Monocytes Absolute: 0.6 10*3/uL (ref 0.1–0.9)
Monocytes: 6 %
Neutrophils Absolute: 5.4 10*3/uL (ref 1.4–7.0)
Neutrophils: 54 %
Platelets: 329 10*3/uL (ref 150–450)
RBC: 5.06 x10E6/uL (ref 3.77–5.28)
RDW: 12.3 % (ref 11.7–15.4)
WBC: 10.1 10*3/uL (ref 3.4–10.8)

## 2023-07-22 LAB — CMP14+EGFR
ALT: 18 [IU]/L (ref 0–32)
AST: 16 [IU]/L (ref 0–40)
Albumin: 4.4 g/dL (ref 3.9–4.9)
Alkaline Phosphatase: 97 [IU]/L (ref 44–121)
BUN/Creatinine Ratio: 17 (ref 9–23)
BUN: 14 mg/dL (ref 6–20)
Bilirubin Total: <0.2 mg/dL (ref 0.0–1.2)
CO2: 19 mmol/L — ABNORMAL LOW (ref 20–29)
Calcium: 9.5 mg/dL (ref 8.7–10.2)
Chloride: 107 mmol/L — ABNORMAL HIGH (ref 96–106)
Creatinine, Ser: 0.83 mg/dL (ref 0.57–1.00)
Globulin, Total: 2.5 g/dL (ref 1.5–4.5)
Glucose: 121 mg/dL — ABNORMAL HIGH (ref 70–99)
Potassium: 4.1 mmol/L (ref 3.5–5.2)
Sodium: 140 mmol/L (ref 134–144)
Total Protein: 6.9 g/dL (ref 6.0–8.5)
eGFR: 97 mL/min/{1.73_m2} (ref >59)

## 2023-07-22 LAB — LIPID PANEL
Chol/HDL Ratio: 4.5 ratio — ABNORMAL HIGH (ref 0.0–4.4)
Cholesterol, Total: 166 mg/dL (ref 100–199)
HDL: 37 mg/dL — ABNORMAL LOW (ref >39)
LDL Chol Calc (NIH): 111 mg/dL — ABNORMAL HIGH (ref 0–99)
Triglycerides: 94 mg/dL (ref 0–149)
VLDL Cholesterol Cal: 18 mg/dL (ref 5–40)

## 2023-07-22 LAB — TSH+FREE T4
Free T4: 1.28 ng/dL (ref 0.82–1.77)
TSH: 1.04 u[IU]/mL (ref 0.450–4.500)

## 2023-07-22 LAB — B12 AND FOLATE PANEL
Folate: 6.1 ng/mL (ref >3.0)
Vitamin B-12: 362 pg/mL (ref 232–1245)

## 2023-07-22 LAB — HEMOGLOBIN A1C
Est. average glucose Bld gHb Est-mCnc: 128 mg/dL
Hgb A1c MFr Bld: 6.1 % — ABNORMAL HIGH (ref 4.8–5.6)

## 2023-07-22 LAB — VITAMIN D 25 HYDROXY (VIT D DEFICIENCY, FRACTURES): Vit D, 25-Hydroxy: 26.2 ng/mL — ABNORMAL LOW (ref 30.0–100.0)

## 2023-07-23 ENCOUNTER — Other Ambulatory Visit: Payer: Self-pay | Admitting: Internal Medicine

## 2023-07-23 DIAGNOSIS — E1165 Type 2 diabetes mellitus with hyperglycemia: Secondary | ICD-10-CM

## 2023-07-28 ENCOUNTER — Telehealth: Payer: Self-pay | Admitting: *Deleted

## 2023-07-28 NOTE — Telephone Encounter (Signed)
Returned call from 1:57 PM. Having an issue with Nexplanon. Patient stated that she will call back due to being in [employer].

## 2023-07-30 ENCOUNTER — Encounter: Payer: Self-pay | Admitting: Obstetrics and Gynecology

## 2023-08-03 NOTE — Progress Notes (Deleted)
   ANNUAL EXAM Patient name: Marie Price MRN [id number]  Date of birth: [birth year] Chief Complaint:   No chief complaint on file.  History of Present Illness:   Marie Price is a 32 y.o. G69P1001 female being seen today for a routine annual exam.   Current concerns: Records reviewed from [facility] - no BRCA testing mentioned. Reviewed [facility] - ***.    Current birth control: Nexplanon  No LMP recorded. Patient has had an implant.   Last Pap/Pap History: ***. Results were: {Pap findings:25134}. H/O abnormal pap: {yes/yes***/no:23866}   Health Maintenance Due  Topic Date Due   OPHTHALMOLOGY EXAM  Never done   Hepatitis C Screening  Never done   DTaP/Tdap/Td (7 - Td or Tdap) 08/22/2021   Cervical Cancer Screening (HPV/Pap Cotest)  09/28/2021   INFLUENZA VACCINE  04/29/2023   COVID-19 Vaccine (1 - 2023-24 season) Never done    Review of Systems:   Pertinent items are noted in HPI Denies any headaches, blurred vision, fatigue, shortness of breath, chest pain, abdominal pain, abnormal vaginal discharge/itching/odor/irritation, problems with periods, bowel movements, urination, or intercourse unless otherwise stated above. *** Pertinent History Reviewed:  Reviewed past medical,surgical, social and family history.  Reviewed problem list, medications and allergies. Physical Assessment:  There were no vitals filed for this visit.There is no height or weight on file to calculate BMI.   Physical Examination:  General appearance - well appearing, and in no distress Mental status - alert, oriented to person, place, and time Psych:  She has a normal mood and affect Skin - warm and dry, normal color, no suspicious lesions noted Chest - effort normal Heart - normal rate  Breasts - breasts appear normal, no suspicious masses, no skin or nipple changes or axillary nodes Abdomen - soft, nontender, nondistended, no masses or organomegaly Pelvic -  VULVA: normal appearing vulva  with no masses, tenderness or lesions  VAGINA: normal appearing vagina with normal color and discharge, no lesions  CERVIX: normal appearing cervix without discharge or lesions, no CMT UTERUS: uterus is felt to be normal size, shape, consistency and nontender  ADNEXA: No adnexal masses or tenderness noted. Extremities:  No swelling or varicosities noted  Chaperone present for exam  No results found for this or any previous visit (from the past 24 hour(s)).  Assessment & Plan:  Diagnoses and all orders for this visit:  Encounter for annual routine gynecological examination  - Cervical cancer screening: Discussed guidelines. Pap with HPV done - Gardasil: {Blank single:19197::"***","has not yet had. Will provide information","completed","has not yet had. Counseling provided and she declines","Has not yet had. Counseling provided and pt accepts"} - GC/CT: {Blank single:19197::"accepts","declines","not indicated"} - Birth Control: Nexplanon - Breast Health: Encouraged self breast awareness/SBE. Teaching provided.  - F/U 12 months and prn     No orders of the defined types were placed in this encounter.   Meds: No orders of the defined types were placed in this encounter.   Follow-up: No follow-ups on file.  Milas Hock, MD 08/03/2023 12:28 PM

## 2023-08-04 ENCOUNTER — Ambulatory Visit: Payer: Self-pay | Admitting: Obstetrics and Gynecology

## 2023-08-04 DIAGNOSIS — Z01419 Encounter for gynecological examination (general) (routine) without abnormal findings: Secondary | ICD-10-CM

## 2023-09-21 DIAGNOSIS — R110 Nausea: Secondary | ICD-10-CM | POA: Diagnosis not present

## 2023-09-21 DIAGNOSIS — Z79899 Other long term (current) drug therapy: Secondary | ICD-10-CM | POA: Diagnosis not present

## 2023-09-21 DIAGNOSIS — R42 Dizziness and giddiness: Secondary | ICD-10-CM | POA: Diagnosis not present

## 2023-09-21 DIAGNOSIS — I10 Essential (primary) hypertension: Secondary | ICD-10-CM | POA: Diagnosis not present

## 2023-10-25 ENCOUNTER — Ambulatory Visit: Payer: Self-pay | Admitting: Internal Medicine

## 2023-10-25 DIAGNOSIS — R1110 Vomiting, unspecified: Secondary | ICD-10-CM | POA: Diagnosis not present

## 2023-10-25 NOTE — Telephone Encounter (Signed)
Copied from CRM [id number]. Topic: Appointments - Appointment Scheduling >> Oct 25, 2023  3:51 PM Twila L wrote: Patient/patient representative is calling to schedule an appointment. Refer to attachments for appointment information.  Chief Complaint: vomiting Symptoms: vomiting; recently diagnosised with vertigo Frequency: started this am Pertinent Negatives: Patient denies fever, dizziness Disposition: [] ED /[x] Urgent Care (no appt availability in office) / [] Appointment(In office/virtual)/ []  [facility]/ [] Home Care/ [] Refused Recommended Disposition /[] [place] [facility]/ []  Follow-up with PCP Additional Notes: States started this am.  Vomiting x 3 and diarrhea x 5.  No apt. Available.  Sent to UC for eval.  Instructed to go to ER if becomes worse.  Pcp office updated.   Reason for Disposition  [1] Constant abdominal pain AND [2] present > 2 hours  Answer Assessment - Initial Assessment Questions 1. VOMITING SEVERITY: "How many times have you vomited in the past 24 hours?"     - MILD:  1 - 2 times/day    - MODERATE: 3 - 5 times/day, decreased oral intake without significant weight loss or symptoms of dehydration    - SEVERE: 6 or more times/day, vomits everything or nearly everything, with significant weight loss, symptoms of dehydration      3 x times today 2. ONSET: "When did the vomiting begin?"      yes 3. FLUIDS: "What fluids or food have you vomited up today?" "Have you been able to keep any fluids down?"     Ginger ale 4. ABDOMEN PAIN: "Are your having any abdomen pain?" If Yes : "How bad is it and what does it feel like?" (e.g., crampy, dull, intermittent, constant)      yes 5. DIARRHEA: "Is there any diarrhea?" If Yes, ask: "How many times today?"      Yes, 5 times 6. CONTACTS: "Is there anyone else in the family with the same symptoms?"      Denies. 7. CAUSE: "What do you think is causing your vomiting?"     unknown 8. HYDRATION STATUS: "Any signs of  dehydration?" (e.g., dry mouth [not only dry lips], too weak to stand) "When did you last urinate?"     Denies, an hour ago I urinated.  9. OTHER SYMPTOMS: "Do you have any other symptoms?" (e.g., fever, headache, vertigo, vomiting blood or coffee grounds, recent head injury)     Denies.  Protocols used: Vomiting-A-AH

## 2023-12-20 ENCOUNTER — Other Ambulatory Visit: Payer: Self-pay | Admitting: Internal Medicine

## 2023-12-20 DIAGNOSIS — E1165 Type 2 diabetes mellitus with hyperglycemia: Secondary | ICD-10-CM

## 2023-12-21 ENCOUNTER — Other Ambulatory Visit (HOSPITAL_COMMUNITY): Payer: Self-pay

## 2023-12-21 ENCOUNTER — Telehealth: Payer: Self-pay | Admitting: Pharmacy Technician

## 2023-12-21 NOTE — Telephone Encounter (Signed)
 Pharmacy Patient Advocate Encounter  Received notification from [facility] that Prior Authorization for Ozempic (1 MG/DOSE) 4MG /3ML pen-injectors has been APPROVED from 12/21/2023 to 11/22/2024. Ran test claim, Copay is $166.62. This test claim was processed through [facility]- copay amounts may vary at other pharmacies due to pharmacy/plan contracts, or as the patient moves through the different stages of their insurance plan.   PA #/Case ID/Reference #: Key: [id number]

## 2023-12-21 NOTE — Telephone Encounter (Signed)
 Pharmacy Patient Advocate Encounter  Received notification from CoverMyMeds that prior authorization for  Ozempic (1 MG/DOSE) 4MG /3ML pen-injectors is required/requested.   Insurance verification completed.   The patient is insured through [facility] .   Per test claim: PA required; PA submitted to above mentioned insurance via CoverMyMeds Key/confirmation #/EOC [id number] Status is pending

## 2023-12-22 ENCOUNTER — Other Ambulatory Visit (HOSPITAL_COMMUNITY): Payer: Self-pay

## 2024-01-19 ENCOUNTER — Encounter: Payer: Self-pay | Admitting: Internal Medicine

## 2024-01-19 ENCOUNTER — Ambulatory Visit: Payer: Medicaid Other | Admitting: Internal Medicine

## 2024-01-19 VITALS — BP 118/75 | HR 98 | Ht 65.0 in | Wt 306.8 lb

## 2024-01-19 DIAGNOSIS — E1165 Type 2 diabetes mellitus with hyperglycemia: Secondary | ICD-10-CM | POA: Diagnosis not present

## 2024-01-19 DIAGNOSIS — Z7985 Long-term (current) use of injectable non-insulin antidiabetic drugs: Secondary | ICD-10-CM

## 2024-01-19 DIAGNOSIS — I152 Hypertension secondary to endocrine disorders: Secondary | ICD-10-CM | POA: Diagnosis not present

## 2024-01-19 DIAGNOSIS — Z7984 Long term (current) use of oral hypoglycemic drugs: Secondary | ICD-10-CM

## 2024-01-19 DIAGNOSIS — E1159 Type 2 diabetes mellitus with other circulatory complications: Secondary | ICD-10-CM | POA: Diagnosis not present

## 2024-01-19 NOTE — Patient Instructions (Signed)
 It was a pleasure to see you today.  Thank you for giving us  the opportunity to be involved in your care.  Below is a brief recap of your visit and next steps.  We will plan to see you again in 6 months.  Summary No medication changes today Repeat A1c and urine study Follow up in 6 months

## 2024-01-19 NOTE — Assessment & Plan Note (Signed)
Remains adequately controlled with olmesartan 40 mg daily.  No medication changes are indicated today.

## 2024-01-19 NOTE — Progress Notes (Signed)
 Established Patient Office Visit  Subjective   Patient ID: Marie Price, female    DOB: 06/13/1991  Age: 33 y.o. MRN: [id number]  Chief Complaint  Patient presents with   Care Management    Six month follow up   [name] returns to care today for routine follow-up.  She was last evaluated by me in October 2024.  Farxiga  was discontinued and we discussed gradually titrating off metformin  if blood sugars remained within goal on Ozempic .  47-month follow-up was arranged for reassessment.  In the interim, she presented to the ER at [facility] on 2 occasions, November and December endorsing left arm pain and symptoms of vertigo respectively.  Urgent care at [facility] on 1/27 reporting nausea with vomiting.  There have otherwise been no acute interval events. [name] reports feeling well today.  She is asymptomatic and has no acute concerns to discuss.  She reports that she was temporarily off Ozempic  from November-February due to a loss of insurance coverage.  She resumed Ozempic  in February and is currently on 1 mg weekly.  Past Medical History:  Diagnosis Date   Abnormal Pap smear    Asthma    Pregnant state, incidental    Urinary tract infection    Past Surgical History:  Procedure Laterality Date   CESAREAN SECTION  08/22/2011   Procedure: CESAREAN SECTION;  Surgeon: Lillie Reining, MD;  Location: [facility];  Service: Gynecology;  Laterality: N/A;  primary of baby girl  at 52  APGAR 9/9   NO PAST SURGERIES     Social History   Tobacco Use   Smoking status: Never   Smokeless tobacco: Never  Substance Use Topics   Alcohol use: No   Drug use: No   Family History  Problem Relation Age of Onset   Hypertension Mother    Diabetes Mother    Hypertension Father    Diabetes Father    Anesthesia problems Neg Hx    No Known Allergies  Review of Systems  Constitutional:  Negative for chills and fever.  HENT:  Negative for sore throat.   Respiratory:  Negative  for cough and shortness of breath.   Cardiovascular:  Negative for chest pain, palpitations and leg swelling.  Gastrointestinal:  Negative for abdominal pain, blood in stool, constipation, diarrhea, nausea and vomiting.  Genitourinary:  Negative for dysuria and hematuria.  Musculoskeletal:  Negative for myalgias.  Skin:  Negative for itching and rash.  Neurological:  Negative for dizziness and headaches.  Psychiatric/Behavioral:  Negative for depression and suicidal ideas.      Objective:     BP 118/75   Pulse 98   Ht 5\' 5"  (1.651 m)   Wt (!) 306 lb 12.8 oz (139.2 kg)   SpO2 96%   BMI 51.05 kg/m  BP Readings from Last 3 Encounters:  01/19/24 118/75  07/21/23 135/83  06/30/23 136/81   Physical Exam Vitals reviewed.  Constitutional:      General: She is not in acute distress.    Appearance: Normal appearance. She is obese. She is not toxic-appearing.  HENT:     Head: Normocephalic and atraumatic.     Right Ear: External ear normal.     Left Ear: External ear normal.     Nose: Nose normal. No congestion or rhinorrhea.     Mouth/Throat:     Mouth: Mucous membranes are moist.     Pharynx: Oropharynx is clear. No oropharyngeal exudate or posterior oropharyngeal erythema.  Eyes:     General: No scleral icterus.    Extraocular Movements: Extraocular movements intact.     Conjunctiva/sclera: Conjunctivae normal.     Pupils: Pupils are equal, round, and reactive to light.  Cardiovascular:     Rate and Rhythm: Normal rate and regular rhythm.     Pulses: Normal pulses.     Heart sounds: Normal heart sounds. No murmur heard.    No friction rub. No gallop.  Pulmonary:     Effort: Pulmonary effort is normal.     Breath sounds: Normal breath sounds. No wheezing, rhonchi or rales.  Abdominal:     General: Abdomen is flat. Bowel sounds are normal. There is no distension.     Palpations: Abdomen is soft.     Tenderness: There is no abdominal tenderness.  Musculoskeletal:         General: No swelling. Normal range of motion.     Cervical back: Normal range of motion.     Right lower leg: No edema.     Left lower leg: No edema.  Lymphadenopathy:     Cervical: No cervical adenopathy.  Skin:    General: Skin is warm and dry.     Capillary Refill: Capillary refill takes less than 2 seconds.     Coloration: Skin is not jaundiced.  Neurological:     General: No focal deficit present.     Mental Status: She is alert and oriented to person, place, and time.  Psychiatric:        Mood and Affect: Mood normal.        Behavior: Behavior normal.   Last CBC Lab Results  Component Value Date   WBC 10.1 07/21/2023   HGB 14.2 07/21/2023   HCT 44.9 07/21/2023   MCV 89 07/21/2023   MCH 28.1 07/21/2023   RDW 12.3 07/21/2023   PLT 329 07/21/2023   Last metabolic panel Lab Results  Component Value Date   GLUCOSE 121 (H) 07/21/2023   NA 140 07/21/2023   K 4.1 07/21/2023   CL 107 (H) 07/21/2023   CO2 19 (L) 07/21/2023   BUN 14 07/21/2023   CREATININE 0.83 07/21/2023   EGFR 97 07/21/2023   CALCIUM 9.5 07/21/2023   PROT 6.9 07/21/2023   ALBUMIN 4.4 07/21/2023   LABGLOB 2.5 07/21/2023   BILITOT <0.2 07/21/2023   ALKPHOS 97 07/21/2023   AST 16 07/21/2023   ALT 18 07/21/2023   ANIONGAP 9 08/22/2022   Last lipids Lab Results  Component Value Date   CHOL 166 07/21/2023   HDL 37 (L) 07/21/2023   LDLCALC 111 (H) 07/21/2023   TRIG 94 07/21/2023   CHOLHDL 4.5 (H) 07/21/2023   Last hemoglobin A1c Lab Results  Component Value Date   HGBA1C 6.1 (H) 07/21/2023   Last thyroid functions Lab Results  Component Value Date   TSH 1.040 07/21/2023   Last vitamin D  Lab Results  Component Value Date   VD25OH 26.2 (L) 07/21/2023   Last vitamin B12 and Folate Lab Results  Component Value Date   VITAMINB12 362 07/21/2023   FOLATE 6.1 07/21/2023     Assessment & Plan:   Problem List Items Addressed This Visit       Hypertension associated with diabetes (HCC)    Remains adequately controlled with olmesartan  40 mg daily.  No medication changes are indicated today.      Type 2 diabetes mellitus with hyperglycemia (HCC) - Primary   A1c 6.1 on labs from October 2024.  She has most recently been prescribed Ozempic  1 mg weekly and metformin  500 mg twice daily.  Farxiga  previously discontinued due to cost with significantly improved diabetes control.  As noted above, she was off of Ozempic  from November-February due to a temporary loss of insurance coverage.  She resumed Ozempic  in late February and has titrated up to 1 mg weekly.  She continues to take metformin  500 mg twice daily. -Repeat A1c and urine microalbumin/creat ratio ordered today.       Return in about 6 months (around 07/20/2024).    Tobi Fortes, MD

## 2024-01-19 NOTE — Assessment & Plan Note (Signed)
 A1c 6.1 on labs from October 2024.  She has most recently been prescribed Ozempic  1 mg weekly and metformin  500 mg twice daily.  Farxiga  previously discontinued due to cost with significantly improved diabetes control.  As noted above, she was off of Ozempic  from November-February due to a temporary loss of insurance coverage.  She resumed Ozempic  in late February and has titrated up to 1 mg weekly.  She continues to take metformin  500 mg twice daily. -Repeat A1c and urine microalbumin/creat ratio ordered today.

## 2024-01-20 ENCOUNTER — Encounter: Payer: Self-pay | Admitting: Internal Medicine

## 2024-01-20 LAB — BAYER DCA HB A1C WAIVED: HB A1C (BAYER DCA - WAIVED): 6.1 % — ABNORMAL HIGH (ref 4.8–5.6)

## 2024-01-22 LAB — MICROALBUMIN / CREATININE URINE RATIO
Creatinine, Urine: 277.6 mg/dL
Microalb/Creat Ratio: 25 mg/g{creat} (ref 0–29)
Microalbumin, Urine: 69.7 ug/mL

## 2024-01-26 DIAGNOSIS — H0288A Meibomian gland dysfunction right eye, upper and lower eyelids: Secondary | ICD-10-CM | POA: Diagnosis not present

## 2024-01-26 DIAGNOSIS — H40013 Open angle with borderline findings, low risk, bilateral: Secondary | ICD-10-CM | POA: Diagnosis not present

## 2024-01-26 DIAGNOSIS — E119 Type 2 diabetes mellitus without complications: Secondary | ICD-10-CM | POA: Diagnosis not present

## 2024-01-26 DIAGNOSIS — H0288B Meibomian gland dysfunction left eye, upper and lower eyelids: Secondary | ICD-10-CM | POA: Diagnosis not present

## 2024-01-26 LAB — HM DIABETES EYE EXAM

## 2024-01-31 ENCOUNTER — Other Ambulatory Visit: Payer: Self-pay | Admitting: Internal Medicine

## 2024-01-31 DIAGNOSIS — E1165 Type 2 diabetes mellitus with hyperglycemia: Secondary | ICD-10-CM

## 2024-02-10 ENCOUNTER — Ambulatory Visit
Admission: RE | Admit: 2024-02-10 | Discharge: 2024-02-10 | Disposition: A | Discharge status: Home / Self Care | Source: Ambulatory Visit | Attending: Family Medicine | Admitting: Family Medicine

## 2024-02-10 ENCOUNTER — Ambulatory Visit: Payer: Self-pay

## 2024-02-10 VITALS — BP 143/93 | HR 117 | Temp 98.6°F | Resp 17

## 2024-02-10 DIAGNOSIS — R229 Localized swelling, mass and lump, unspecified: Secondary | ICD-10-CM | POA: Diagnosis not present

## 2024-02-10 MED ORDER — NAPROXEN SODIUM 550 MG PO TABS: 550.0000 mg | ORAL_TABLET | Freq: Two times a day (BID) | ORAL | 0 refills | Status: DC

## 2024-02-10 NOTE — ED Provider Notes (Signed)
 [name]    CSN: [id number] Arrival date & time: 02/10/24  1506      History   Chief Complaint Chief Complaint  Patient presents with   Knee Pain    LT    HPI Marie Price is a 33 y.o. female.   Pleasant 33 year old woman.  Works for the [employer].  She states she has had no accident or injury.  No overuse.  No change in activity.  She states she was laying in bed and noticed a soreness behind her knee.  She palpated the area she felt a pop.  She looked this up on [employer] and realized there is a possibility could be a blood clot.  She is here to rule out blood clot based on her home research.  She has never had a blood clot before.  No clotting disorder.  No problems with her menstrual periods she does have a Nexplanon  implant.  She is a non-smoker    Past Medical History:  Diagnosis Date   Abnormal Pap smear    Asthma    Pregnant state, incidental    Urinary tract infection     Patient Active Problem List   Diagnosis Date Noted   Seasonal allergic rhinitis 02/08/2023   Cervical cancer screening 10/05/2022   Type 2 diabetes mellitus with hyperglycemia (HCC) 08/25/2022   Hypertension associated with diabetes (HCC) 08/25/2022    Past Surgical History:  Procedure Laterality Date   CESAREAN SECTION  08/22/2011   Procedure: CESAREAN SECTION;  Surgeon: Lillie Reining, MD;  Location: [facility];  Service: Gynecology;  Laterality: N/A;  primary of baby girl  at 0634  APGAR 9/9   NO PAST SURGERIES      OB History     Gravida  1   Para  1   Term  1   Preterm      AB      Living  1      SAB      IAB      Ectopic      Multiple      Live Births  1            Home Medications    Prior to Admission medications   Medication Sig Start Date End Date Taking? Authorizing Provider  naproxen sodium (ANAPROX DS) 550 MG tablet Take 1 tablet (550 mg total) by mouth 2 (two) times daily with a meal. 02/10/24  Yes Stephany Ehrich, MD   etonogestrel  (NEXPLANON ) 68 MG IMPL implant 1 each by Subdermal route once.    [provider]  fluticasone  (FLONASE ) 50 MCG/ACT nasal spray Place 2 sprays into both nostrils in the morning and at bedtime for 2 days, THEN 1 spray daily as needed for up to 28 days for allergies or rhinitis. 02/08/23 04/21/23  Lillia Reilly, MD  metFORMIN  (GLUCOPHAGE ) 1000 MG tablet Take 0.5 tablets (500 mg total) by mouth 2 (two) times daily with a meal. 07/21/23   Tobi Fortes, MD  olmesartan  (BENICAR ) 40 MG tablet Take 1 tablet (40 mg total) by mouth daily. 07/21/23 01/17/24  Tobi Fortes, MD  OZEMPIC , 1 MG/DOSE, 4 MG/3ML SOPN INJECT 1MG   SUBCUTANEOUSLY ONCE A WEEK 01/31/24   Tobi Fortes, MD    Family History Family History  Problem Relation Age of Onset   Hypertension Mother    Diabetes Mother    Hypertension Father    Diabetes Father    Anesthesia problems Neg  Hx     Social History Social History   Tobacco Use   Smoking status: Never   Smokeless tobacco: Never  Substance Use Topics   Alcohol use: No   Drug use: No     Allergies   Patient has no known allergies.   Review of Systems Review of Systems See HPI  Physical Exam Triage Vital Signs ED Triage Vitals  Encounter Vitals Group     BP 02/10/24 1516 (!) 143/93     Systolic BP Percentile --      Diastolic BP Percentile --      Pulse Rate 02/10/24 1516 (!) 117     Resp 02/10/24 1516 17     Temp 02/10/24 1516 98.6 F (37 C)     Temp Source 02/10/24 1516 Oral     SpO2 02/10/24 1516 96 %     Weight --      Height --      Head Circumference --      Peak Flow --      Pain Score 02/10/24 1517 1     Pain Loc --      Pain Education --      Exclude from Growth Chart --    No data found.  Updated Vital Signs BP (!) 143/93 (BP Location: Right Arm)   Pulse (!) 117   Temp 98.6 F (37 C) (Oral)   Resp 17   LMP 01/21/2024 (Approximate)   SpO2 96%      Physical Exam Constitutional:      General: She is  not in acute distress.    Appearance: She is well-developed. She is obese.  HENT:     Head: Normocephalic and atraumatic.  Eyes:     Conjunctiva/sclera: Conjunctivae normal.     Pupils: Pupils are equal, round, and reactive to light.  Cardiovascular:     Rate and Rhythm: Normal rate.  Pulmonary:     Effort: Pulmonary effort is normal. No respiratory distress.  Abdominal:     General: There is no distension.     Palpations: Abdomen is soft.  Musculoskeletal:        General: Normal range of motion.     Cervical back: Normal range of motion.       Legs:  Skin:    General: Skin is warm and dry.  Neurological:     Mental Status: She is alert.     Gait: Gait normal.      UC Treatments / Results  Labs (all labs ordered are listed, but only abnormal results are displayed) Labs Reviewed - No data to display  EKG   Radiology No results found.  Procedures Procedures (including critical care time)  Medications Ordered in UC Medications - No data to display  Initial Impression / Assessment and Plan / UC Course  I have reviewed the triage vital signs and the nursing notes.  Pertinent labs & imaging results that were available during my care of the patient were reviewed by me and considered in my medical decision making (see chart for details).     Knee exam is completely normal.  Good range of motion, normal strength, no effusion, no joint line tenderness, no instability.  There is a small subcutaneous nodule that is palpable but it is not associated with the venous system.  Patient is reassured Final Clinical Impressions(s) / UC Diagnoses   Final diagnoses:  Subcutaneous mass   Discharge Instructions      Take the naproxen 2  x a day with food Warmth to area See your doctor if this does not resolve over a couple of weeks  ED Prescriptions     Medication Sig Dispense Auth. Provider   naproxen sodium (ANAPROX DS) 550 MG tablet Take 1 tablet (550 mg total) by mouth  2 (two) times daily with a meal. 14 tablet Stephany Ehrich, MD      PDMP not reviewed this encounter.   Stephany Ehrich, MD 02/10/24 [phone]

## 2024-02-10 NOTE — Telephone Encounter (Signed)
  Chief Complaint: left knee pain Symptoms: knee painful to touch, 6/10 Frequency: x one week Pertinent Negatives: Patient denies  Disposition: [] ED /[x] Urgent Care (no appt availability in office) / [] Appointment(In office/virtual)/ []  [facility]/ [] Home Care/ [] Refused Recommended Disposition /[] [facility]/ []  Follow-up with PCP Additional Notes: patient unable to go to home clinic.  Going to UC in [place] for left knee pain Copied from CRM [id number]. Topic: Clinical - Red Word Triage >> Feb 10, 2024  2:18 PM Hamp Levine R wrote: Red Word that prompted transfer to Nurse Triage: Patient calling stating she is having pain/tenderness behind her kneecap. States there is a knot that when you rub it the knot moves. Pain/tenderness is starting to move towards the front of her knee. Reason for Disposition  [1] MODERATE pain (e.g., interferes with normal activities, limping) AND [2] present > 3 days  Answer Assessment - Initial Assessment Questions 1. LOCATION and RADIATION: "Where is the pain located?"      Left knee pain 2. QUALITY: "What does the pain feel like?"  (e.g., sharp, dull, aching, burning)     Tender to touch 3. SEVERITY: "How bad is the pain?" "What does it keep you from doing?"   (Scale 1-10; or mild, moderate, severe)   -  MILD (1-3): doesn't interfere with normal activities    -  MODERATE (4-7): interferes with normal activities (e.g., work or school) or awakens from sleep, limping    -  SEVERE (8-10): excruciating pain, unable to do any normal activities, unable to walk     6/10 4. ONSET: "When did the pain start?" "Does it come and go, or is it there all the time?"     One week ago 5. RECURRENT: "Have you had this pain before?" If Yes, ask: "When, and what happened then?"     no  7. AGGRAVATING FACTORS: "What makes the knee pain worse?" (e.g., walking, climbing stairs, running)     States that her skin is sore if anything touches the back of her  knee 8. ASSOCIATED SYMPTOMS: "Is there any swelling or redness of the knee?"     no 9. OTHER SYMPTOMS: "Do you have any other symptoms?" (e.g., chest pain, difficulty breathing, fever, calf pain)     no 10. PREGNANCY: "Is there any chance you are pregnant?" "When was your last menstrual period?"       no  Protocols used: Knee Pain-A-AH

## 2024-02-10 NOTE — ED Triage Notes (Signed)
 Pt c/o LT knee pain x 1 week. Tender to touch on side and back of knee. Denies injury.

## 2024-02-10 NOTE — Discharge Instructions (Signed)
 Take the naproxen 2 x a day with food Warmth to area See your doctor if this does not resolve over a couple of weeks

## 2024-02-11 NOTE — Telephone Encounter (Signed)
 Called in regards to knee pain left voicemail to call back

## 2024-03-03 ENCOUNTER — Ambulatory Visit
Admission: RE | Admit: 2024-03-03 | Discharge: 2024-03-03 | Disposition: A | Discharge status: Home / Self Care | Source: Ambulatory Visit | Attending: Family Medicine | Admitting: Family Medicine

## 2024-03-03 ENCOUNTER — Other Ambulatory Visit: Payer: Self-pay

## 2024-03-03 VITALS — BP 140/101 | HR 111 | Temp 99.2°F | Resp 18

## 2024-03-03 DIAGNOSIS — J069 Acute upper respiratory infection, unspecified: Secondary | ICD-10-CM

## 2024-03-03 DIAGNOSIS — J0100 Acute maxillary sinusitis, unspecified: Secondary | ICD-10-CM | POA: Diagnosis not present

## 2024-03-03 MED ORDER — AMOXICILLIN-POT CLAVULANATE 875-125 MG PO TABS: ORAL_TABLET | ORAL | 0 refills | Status: DC

## 2024-03-03 NOTE — ED Provider Notes (Signed)
 [facility]    CSN: [id number] Arrival date & time: 03/03/24  0931      History   Chief Complaint Chief Complaint  Patient presents with   Headache    HPI Marie Price is a 33 y.o. female.   Patient complains of four day history of cough (worse at night), mild sore throat, headache, and facial pressure.  She denies fever.  She has seasonal allergies and history of asthma during childhood.  She has taken two covid tests that were negative.   The history is provided by the patient.    Past Medical History:  Diagnosis Date   Abnormal Pap smear    Asthma    Pregnant state, incidental    Urinary tract infection     Patient Active Problem List   Diagnosis Date Noted   Seasonal allergic rhinitis 02/08/2023   Cervical cancer screening 10/05/2022   Type 2 diabetes mellitus with hyperglycemia (HCC) 08/25/2022   Hypertension associated with diabetes (HCC) 08/25/2022    Past Surgical History:  Procedure Laterality Date   CESAREAN SECTION  08/22/2011   Procedure: CESAREAN SECTION;  Surgeon: Lillie Reining, MD;  Location: [facility];  Service: Gynecology;  Laterality: N/A;  primary of baby girl  at 0634  APGAR 9/9   NO PAST SURGERIES      OB History     Gravida  1   Para  1   Term  1   Preterm      AB      Living  1      SAB      IAB      Ectopic      Multiple      Live Births  1            Home Medications    Prior to Admission medications   Medication Sig Start Date End Date Taking? Authorizing Provider  amoxicillin -clavulanate (AUGMENTIN ) 875-125 MG tablet Take one tab PO Q12hr with food 03/03/24  Yes Leon Rajas, MD  etonogestrel  (NEXPLANON ) 68 MG IMPL implant 1 each by Subdermal route once.    [provider]  fluticasone  (FLONASE ) 50 MCG/ACT nasal spray Place 2 sprays into both nostrils in the morning and at bedtime for 2 days, THEN 1 spray daily as needed for up to 28 days for allergies or rhinitis. 02/08/23  04/21/23  Lillia Reilly, MD  metFORMIN  (GLUCOPHAGE ) 1000 MG tablet Take 0.5 tablets (500 mg total) by mouth 2 (two) times daily with a meal. 07/21/23   Tobi Fortes, MD  naproxen  sodium (ANAPROX  DS) 550 MG tablet Take 1 tablet (550 mg total) by mouth 2 (two) times daily with a meal. 02/10/24   Stephany Ehrich, MD  olmesartan  (BENICAR ) 40 MG tablet Take 1 tablet (40 mg total) by mouth daily. 07/21/23 01/17/24  Tobi Fortes, MD  OZEMPIC , 1 MG/DOSE, 4 MG/3ML SOPN INJECT 1MG   SUBCUTANEOUSLY ONCE A WEEK 01/31/24   Tobi Fortes, MD    Family History Family History  Problem Relation Age of Onset   Hypertension Mother    Diabetes Mother    Hypertension Father    Diabetes Father    Anesthesia problems Neg Hx     Social History Social History   Tobacco Use   Smoking status: Never   Smokeless tobacco: Never  Substance Use Topics   Alcohol use: No   Drug use: No     Allergies   Patient has no  known allergies.   Review of Systems Review of Systems + sore throat + cough No pleuritic pain No wheezing + nasal congestion + post-nasal drainage + sinus pain/pressure No itchy/red eyes No earache No hemoptysis No SOB No fever No nausea No vomiting No abdominal pain No diarrhea No urinary symptoms No skin rash No fatigue No myalgias + headache Used OTC meds (Claritin) without relief   Physical Exam Triage Vital Signs ED Triage Vitals  Encounter Vitals Group     BP 03/03/24 1011 (!) 140/101     Systolic BP Percentile --      Diastolic BP Percentile --      Pulse Rate 03/03/24 1011 (!) 111     Resp 03/03/24 1011 18     Temp 03/03/24 1011 99.2 F (37.3 C)     Temp src --      SpO2 03/03/24 1011 99 %     Weight --      Height --      Head Circumference --      Peak Flow --      Pain Score 03/03/24 1012 8     Pain Loc --      Pain Education --      Exclude from Growth Chart --    No data found.  Updated Vital Signs BP (!) 140/101   Pulse (!) 111    Temp 99.2 F (37.3 C)   Resp 18   LMP 01/21/2024 (Approximate)   SpO2 99%   Visual Acuity Right Eye Distance:   Left Eye Distance:   Bilateral Distance:    Right Eye Near:   Left Eye Near:    Bilateral Near:     Physical Exam Nursing notes and Vital Signs reviewed. Appearance:  Patient appears stated age, and in no acute distress Eyes:  Pupils are equal, round, and reactive to light and accomodation.  Extraocular movement is intact.  Conjunctivae are not inflamed  Ears:  Canals normal.  Tympanic membranes normal.  Nose:  Congested turbinates.  Maxillary and frontal sinus tenderness is present.  Pharynx:  Normal Neck:  Supple.  Mildly enlarged lateral nodes are present, tender to palpation on the left.   Lungs:  Clear to auscultation.  Breath sounds are equal.  Moving air well. Heart:  Regular rate and rhythm without murmurs, rubs, or gallops.  Abdomen:  Nontender without masses or hepatosplenomegaly.  Bowel sounds are present.  No CVA or flank tenderness.  Extremities:  No edema.  Skin:  No rash present.   UC Treatments / Results  Labs (all labs ordered are listed, but only abnormal results are displayed) Labs Reviewed - No data to display  EKG   Radiology No results found.  Procedures Procedures (including critical care time)  Medications Ordered in UC Medications - No data to display  Initial Impression / Assessment and Plan / UC Course  I have reviewed the triage vital signs and the nursing notes.  Pertinent labs & imaging results that were available during my care of the patient were reviewed by me and considered in my medical decision making (see chart for details).    Begin Augmentin  875 Q12hr for one week. Followup with Family Doctor if not improved in 10 days.  Final Clinical Impressions(s) / UC Diagnoses   Final diagnoses:  Viral URI with cough  Acute maxillary sinusitis, recurrence not specified     Discharge Instructions      Take plain  guaifenesin (1200mg  extended release tabs such  as Mucinex) twice daily, with plenty of water, for cough and congestion.   Get adequate rest.   May use Afrin nasal spray (or generic oxymetazoline) each morning for about 5 days and then discontinue.  Also recommend using saline nasal spray several times daily and saline nasal irrigation (AYR is a common brand).  Use Flonase  nasal spray each morning after using Afrin nasal spray and saline nasal irrigation. Try warm salt water gargles for sore throat.  May take Delsym Cough Suppressant ("12 Hour Cough Relief") at bedtime for nighttime cough.  Stop all antihistamines for now, and other non-prescription cough/cold preparations.    ED Prescriptions     Medication Sig Dispense Auth. Provider   amoxicillin -clavulanate (AUGMENTIN ) 875-125 MG tablet Take one tab PO Q12hr with food 14 tablet Leon Rajas, MD         Leon Rajas, MD 03/05/24 1919

## 2024-03-03 NOTE — Discharge Instructions (Signed)
 Take plain guaifenesin (1200mg  extended release tabs such as Mucinex) twice daily, with plenty of water, for cough and congestion.   Get adequate rest.   May use Afrin nasal spray (or generic oxymetazoline) each morning for about 5 days and then discontinue.  Also recommend using saline nasal spray several times daily and saline nasal irrigation (AYR is a common brand).  Use Flonase  nasal spray each morning after using Afrin nasal spray and saline nasal irrigation. Try warm salt water gargles for sore throat.  May take Delsym Cough Suppressant ("12 Hour Cough Relief") at bedtime for nighttime cough.  Stop all antihistamines for now, and other non-prescription cough/cold preparations.

## 2024-03-03 NOTE — ED Triage Notes (Addendum)
 Works for [employer], wonders if she has an allergy to something at work. Has c/o head cold, feels pressure when turning head, cough which is productive of clear sputum. No fever. Has taken claritin. Has taken 2 covid tests which were negative.

## 2024-03-14 ENCOUNTER — Other Ambulatory Visit: Payer: Self-pay | Admitting: Internal Medicine

## 2024-03-14 DIAGNOSIS — E1165 Type 2 diabetes mellitus with hyperglycemia: Secondary | ICD-10-CM

## 2024-03-17 ENCOUNTER — Ambulatory Visit

## 2024-03-20 ENCOUNTER — Ambulatory Visit: Payer: Self-pay

## 2024-03-20 NOTE — Telephone Encounter (Signed)
 Patient scheduled.

## 2024-03-20 NOTE — Telephone Encounter (Signed)
 FYI Only or Action Required?: Action required by provider: request for appointment.  Patient was last seen in primary care on 01/19/2024 by Melvenia Manus BRAVO, MD. Called Nurse Triage reporting Nausea. Symptoms began about a month ago. Interventions attempted: Rest, hydration, or home remedies. Symptoms are: gradually worsening.  Triage Disposition: See PCP When Office is Open (Within 3 Days)  Patient/caregiver understands and will follow disposition?: YesCopied from CRM [id number]. Topic: Clinical - Red Word Triage >> Mar 20, 2024  3:39 PM Donee H wrote: Red Word that prompted transfer to Nurse Triage: Patient experiencing severe headaches and nausea. Patient states she recently stated OZEMPIC , 1 MG/DOSE, 4 MG/3ML SOPN and think this could be a side effect. Patient stated experiencing a light head as well sort of like motion sickness. Reason for Disposition  Nausea lasts > 1 week  Answer Assessment - Initial Assessment Questions 1. NAUSEA SEVERITY: How bad is the nausea? (e.g., mild, moderate, severe; dehydration, weight loss)   - MILD: loss of appetite without change in eating habits   - MODERATE: decreased oral intake without significant weight loss, dehydration, or malnutrition   - SEVERE: inadequate caloric or fluid intake, significant weight loss, symptoms of dehydration     moderate 2. ONSET: When did the nausea begin?     Several weeks when heating up 3. VOMITING: Any vomiting? If Yes, ask: How many times today?     Several times 4. RECURRENT SYMPTOM: Have you had nausea before? If Yes, ask: When was the last time? What happened that time?     yes 5. CAUSE: What do you think is causing the nausea?     Ozempic  and working outside    [employer] works for [employer] and feels heat is causing nausea and headaches. Pt stated she is drinking so much water and feels the water intake and heat is causing her sugars to drop. Pt is also requesting FMLA to be restarted and need  paperwork filled out.  Protocols used: Nausea-A-AH

## 2024-03-21 ENCOUNTER — Ambulatory Visit: Payer: Self-pay | Admitting: Internal Medicine

## 2024-03-27 ENCOUNTER — Encounter: Payer: Self-pay | Admitting: Internal Medicine

## 2024-04-24 ENCOUNTER — Other Ambulatory Visit (HOSPITAL_COMMUNITY): Payer: Self-pay

## 2024-06-22 ENCOUNTER — Ambulatory Visit
Admission: RE | Admit: 2024-06-22 | Discharge: 2024-06-22 | Disposition: A | Discharge status: Home / Self Care | Attending: Family Medicine | Admitting: Family Medicine

## 2024-06-22 VITALS — BP 163/113 | HR 93 | Temp 99.2°F | Resp 17

## 2024-06-22 DIAGNOSIS — R42 Dizziness and giddiness: Secondary | ICD-10-CM

## 2024-06-22 HISTORY — DX: Type 2 diabetes mellitus without complications: E11.9

## 2024-06-22 HISTORY — DX: Essential (primary) hypertension: I10

## 2024-06-22 LAB — POCT FASTING CBG KUC MANUAL ENTRY: POCT Glucose (KUC): 158 mg/dL — AB (ref 70–99)

## 2024-06-22 MED ORDER — MECLIZINE HCL 25 MG PO TABS: 25.0000 mg | ORAL_TABLET | Freq: Two times a day (BID) | ORAL | 0 refills | Status: DC | PRN

## 2024-06-22 NOTE — Discharge Instructions (Addendum)
 Advised patient may take meclizine  daily or as needed for vertigo/dizziness.  Advised if symptoms worsen and/or unresolved please follow-up with your PCP or go to nearest ED.  Advised please follow-up with PCP for refill of diabetic medications.  Advised patient of [place] primary care providers with contact information provided at this office visit.

## 2024-06-22 NOTE — ED Provider Notes (Signed)
 Marie Price    CSN: [id number] Arrival date & time: 06/22/24  1702      History   Chief Complaint Chief Complaint  Patient presents with   Dizziness    Vertigo or diabetes - Entered by patient    HPI Marie Price is a 33 y.o. female.   HPI 33 year old female presents with dizziness and believes this may be due to vertigo or diabetes.  PMH significant for morbid obesity, HTN, and T2 DM.  Past Medical History:  Diagnosis Date   Abnormal Pap smear    Asthma    Diabetes mellitus without complication (HCC)    Hypertension    Pregnant state, incidental    Urinary tract infection     Patient Active Problem List   Diagnosis Date Noted   Seasonal allergic rhinitis 02/08/2023   Cervical cancer screening 10/05/2022   Type 2 diabetes mellitus with hyperglycemia (HCC) 08/25/2022   Hypertension associated with diabetes (HCC) 08/25/2022    Past Surgical History:  Procedure Laterality Date   CESAREAN SECTION  08/22/2011   Procedure: CESAREAN SECTION;  Surgeon: Krystal JONETTA Deaner, MD;  Location: [facility];  Service: Gynecology;  Laterality: N/A;  primary of baby girl  at 0634  APGAR 9/9   NO PAST SURGERIES      OB History     Gravida  1   Para  1   Term  1   Preterm      AB      Living  1      SAB      IAB      Ectopic      Multiple      Live Births  1            Home Medications    Prior to Admission medications   Medication Sig Start Date End Date Taking? Authorizing Provider  meclizine  (ANTIVERT ) 25 MG tablet Take 1 tablet (25 mg total) by mouth 2 (two) times daily as needed for dizziness. 06/22/24  Yes Teddy Sharper, FNP  etonogestrel  (NEXPLANON ) 68 MG IMPL implant 1 each by Subdermal route once.    [provider]  fluticasone  (FLONASE ) 50 MCG/ACT nasal spray Place 2 sprays into both nostrils in the morning and at bedtime for 2 days, THEN 1 spray daily as needed for up to 28 days for allergies or rhinitis. 02/08/23 04/21/23   Golda Lynwood PARAS, MD  metFORMIN  (GLUCOPHAGE ) 1000 MG tablet Take 0.5 tablets (500 mg total) by mouth 2 (two) times daily with a meal. 07/21/23   Melvenia Manus BRAVO, MD  naproxen  sodium (ANAPROX  DS) 550 MG tablet Take 1 tablet (550 mg total) by mouth 2 (two) times daily with a meal. 02/10/24   Maranda Jamee Jacob, MD  olmesartan  (BENICAR ) 40 MG tablet Take 1 tablet (40 mg total) by mouth daily. 07/21/23 01/17/24  Melvenia Manus BRAVO, MD  OZEMPIC , 1 MG/DOSE, 4 MG/3ML SOPN INJECT 1MG  SUBCUTANEOUSLY ONCE A WEEK 03/15/24   Melvenia Manus BRAVO, MD    Family History Family History  Problem Relation Age of Onset   Hypertension Mother    Diabetes Mother    Hypertension Father    Diabetes Father    Anesthesia problems Neg Hx     Social History Social History   Tobacco Use   Smoking status: Never   Smokeless tobacco: Never  Substance Use Topics   Alcohol use: No   Drug use: No     Allergies   Patient has  no known allergies.   Review of Systems Review of Systems   Physical Exam Triage Vital Signs ED Triage Vitals  Encounter Vitals Group     BP 06/22/24 1708 (!) 163/113     Girls Systolic BP Percentile --      Girls Diastolic BP Percentile --      Boys Systolic BP Percentile --      Boys Diastolic BP Percentile --      Pulse Rate 06/22/24 1708 93     Resp 06/22/24 1708 17     Temp 06/22/24 1708 99.2 F (37.3 C)     Temp Source 06/22/24 1708 Oral     SpO2 06/22/24 1708 98 %     Weight --      Height --      Head Circumference --      Peak Flow --      Pain Score 06/22/24 1710 0     Pain Loc --      Pain Education --      Exclude from Growth Chart --    No data found.  Updated Vital Signs BP (!) 163/113 (BP Location: Right Arm)   Pulse 93   Temp 99.2 F (37.3 C) (Oral)   Resp 17   LMP 05/28/2024 (Approximate)   SpO2 98%    Physical Exam Vitals and nursing note reviewed.  Constitutional:      Appearance: Normal appearance. She is obese.  HENT:     Head: Normocephalic  and atraumatic.     Right Ear: Tympanic membrane, ear canal and external ear normal.     Left Ear: Tympanic membrane, ear canal and external ear normal.     Mouth/Throat:     Mouth: Mucous membranes are moist.     Pharynx: Oropharynx is clear.  Eyes:     Extraocular Movements: Extraocular movements intact.     Pupils: Pupils are equal, round, and reactive to light.  Cardiovascular:     Rate and Rhythm: Normal rate and regular rhythm.     Pulses: Normal pulses.     Heart sounds: Normal heart sounds.  Pulmonary:     Effort: Pulmonary effort is normal.     Breath sounds: Normal breath sounds. No wheezing, rhonchi or rales.  Musculoskeletal:        General: Normal range of motion.     Cervical back: Normal range of motion and neck supple.  Skin:    General: Skin is warm and dry.  Neurological:     General: No focal deficit present.     Mental Status: She is alert and oriented to person, place, and time. Mental status is at baseline.  Psychiatric:        Mood and Affect: Mood normal.        Behavior: Behavior normal.      UC Treatments / Results  Labs (all labs ordered are listed, but only abnormal results are displayed) Labs Reviewed  POCT FASTING CBG [facility] MANUAL ENTRY - Abnormal; Notable for the following components:      Result Value   POCT Glucose ([facility]) 158 (*)    All other components within normal limits    EKG   Radiology No results found.  Procedures Procedures (including critical Price time)  Medications Ordered in UC Medications - No data to display  Initial Impression / Assessment and Plan / UC Course  I have reviewed the triage vital signs and the nursing notes.  Pertinent labs & imaging results  that were available during my Price of the patient were reviewed by me and considered in my medical decision making (see chart for details).     MDM: 1.  Vertigo-Rx'd meclizine  25 mg tablet: Take 1 tablet 2 times daily, as needed for vertigo/dizziness. Advised  patient may take meclizine  daily or as needed for vertigo/dizziness.  Advised if symptoms worsen and/or unresolved please follow-up with your PCP or go to nearest ED.  Advised please follow-up with PCP for refill of diabetic medications.  Advised patient of [place] primary Price providers with contact information provided at this office visit.  Patient discharged home, hemodynamically stable Final Clinical Impressions(s) / UC Diagnoses   Final diagnoses:  Vertigo     Discharge Instructions      Advised patient may take meclizine  daily or as needed for vertigo/dizziness.  Advised if symptoms worsen and/or unresolved please follow-up with your PCP or go to nearest ED.  Advised please follow-up with PCP for refill of diabetic medications.  Advised patient of  primary Price providers with contact information provided at this office visit.     ED Prescriptions     Medication Sig Dispense Auth. Provider   meclizine  (ANTIVERT ) 25 MG tablet Take 1 tablet (25 mg total) by mouth 2 (two) times daily as needed for dizziness. 21 tablet Kassidy Dockendorf, FNP      PDMP not reviewed this encounter.   Teddy Sharper, FNP 06/22/24 1749

## 2024-06-22 NOTE — ED Triage Notes (Signed)
 Pt c/o dizziness x 4 days. Worsening in last 2 days. Hx of vertigo. Does not have anymore meclizine . Needs new PCP due to previous one moving. Also is supposed to be on BP and diabetes meds which she's been out of for a month.

## 2024-07-19 ENCOUNTER — Ambulatory Visit

## 2024-07-19 VITALS — BP 149/101 | HR 94 | Ht 65.0 in | Wt 302.0 lb

## 2024-07-19 DIAGNOSIS — E1165 Type 2 diabetes mellitus with hyperglycemia: Secondary | ICD-10-CM | POA: Diagnosis not present

## 2024-07-19 DIAGNOSIS — Z6841 Body Mass Index (BMI) 40.0 and over, adult: Secondary | ICD-10-CM

## 2024-07-19 DIAGNOSIS — Z7985 Long-term (current) use of injectable non-insulin antidiabetic drugs: Secondary | ICD-10-CM

## 2024-07-19 DIAGNOSIS — I152 Hypertension secondary to endocrine disorders: Secondary | ICD-10-CM

## 2024-07-19 DIAGNOSIS — E1159 Type 2 diabetes mellitus with other circulatory complications: Secondary | ICD-10-CM

## 2024-07-19 DIAGNOSIS — Z7984 Long term (current) use of oral hypoglycemic drugs: Secondary | ICD-10-CM

## 2024-07-19 MED ORDER — OLMESARTAN MEDOXOMIL 40 MG PO TABS
40.0000 mg | ORAL_TABLET | Freq: Every day | ORAL | 1 refills | Status: AC
Start: 2024-07-19 — End: 2025-01-15

## 2024-07-19 MED ORDER — METFORMIN HCL 1000 MG PO TABS: 500.0000 mg | ORAL_TABLET | Freq: Two times a day (BID) | ORAL | 3 refills | Status: AC

## 2024-07-19 MED ORDER — OZEMPIC (1 MG/DOSE) 4 MG/3ML ~~LOC~~ SOPN: 1.0000 mg | PEN_INJECTOR | SUBCUTANEOUS | 3 refills | Status: AC
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
## 2024-07-21 LAB — CMP14+EGFR
ALT: 20 IU/L (ref 0–32)
AST: 14 IU/L (ref 0–40)
Albumin: 4.3 g/dL (ref 3.9–4.9)
Alkaline Phosphatase: 100 IU/L (ref 41–116)
BUN/Creatinine Ratio: 11 (ref 9–23)
BUN: 9 mg/dL (ref 6–20)
Bilirubin Total: 0.2 mg/dL (ref 0.0–1.2)
CO2: 22 mmol/L (ref 20–29)
Calcium: 9.4 mg/dL (ref 8.7–10.2)
Chloride: 102 mmol/L (ref 96–106)
Creatinine, Ser: 0.8 mg/dL (ref 0.57–1.00)
Globulin, Total: 2.7 g/dL (ref 1.5–4.5)
Glucose: 122 mg/dL — ABNORMAL HIGH (ref 70–99)
Potassium: 4.3 mmol/L (ref 3.5–5.2)
Sodium: 138 mmol/L (ref 134–144)
Total Protein: 7 g/dL (ref 6.0–8.5)
eGFR: 100 mL/min/1.73 (ref >59)

## 2024-07-21 LAB — LIPID PANEL
Chol/HDL Ratio: 4.5 ratio — ABNORMAL HIGH (ref 0.0–4.4)
Cholesterol, Total: 188 mg/dL (ref 100–199)
HDL: 42 mg/dL (ref >39)
LDL Chol Calc (NIH): 129 mg/dL — ABNORMAL HIGH (ref 0–99)
Triglycerides: 91 mg/dL (ref 0–149)
VLDL Cholesterol Cal: 17 mg/dL (ref 5–40)

## 2024-07-21 LAB — VITAMIN D 25 HYDROXY (VIT D DEFICIENCY, FRACTURES): Vit D, 25-Hydroxy: 20.4 ng/mL — ABNORMAL LOW (ref 30.0–100.0)

## 2024-07-21 LAB — MICROALBUMIN / CREATININE URINE RATIO
Creatinine, Urine: 183.3 mg/dL
Microalb/Creat Ratio: 59 mg/g{creat} — AB (ref 0–29)
Microalbumin, Urine: 108.7 ug/mL

## 2024-07-21 LAB — HEMOGLOBIN A1C
Est. average glucose Bld gHb Est-mCnc: 154 mg/dL
Hgb A1c MFr Bld: 7 % — ABNORMAL HIGH (ref 4.8–5.6)

## 2024-07-23 ENCOUNTER — Ambulatory Visit: Payer: Self-pay

## 2024-07-23 NOTE — Assessment & Plan Note (Signed)
 Lapse in medication adherence; out of Ozempic  for a month, continues metformin . Diabetic foot exam due. - Send prescription refills for Ozempic  and metformin  to [facility] pharmacy. - Perform diabetic foot exam. - Order blood work including A1c.

## 2024-07-23 NOTE — Assessment & Plan Note (Signed)
 Recent lapse in medication adherence; blood pressure slightly elevated. - Send prescription refill for Benicar  to [facility] pharmacy. - Follow up in three months to assess blood pressure control on medication.

## 2024-07-23 NOTE — Assessment & Plan Note (Signed)
 BMI 50.0-59.9. Considering resuming Ozempic  for weight management. - Discuss potential increase in Ozempic  dosage at next visit if she is ready.

## 2024-08-07 ENCOUNTER — Other Ambulatory Visit: Payer: Self-pay

## 2024-08-07 DIAGNOSIS — E1165 Type 2 diabetes mellitus with hyperglycemia: Secondary | ICD-10-CM

## 2024-08-07 MED ORDER — BLOOD GLUCOSE TEST VI STRP
1.0000 | ORAL_STRIP | 0 refills | Status: AC
Start: 2024-08-07 — End: ?

## 2024-08-07 MED ORDER — BLOOD GLUCOSE MONITORING SUPPL DEVI: 1.0000 | 0 refills | Status: AC

## 2024-08-07 MED ORDER — LANCET DEVICE MISC: 1.0000 | 0 refills | Status: AC

## 2024-08-07 MED ORDER — LANCETS MISC: 1.0000 | 0 refills | Status: AC

## 2024-08-07 NOTE — Telephone Encounter (Signed)
 Glucose monitor sent to [facility] in [place]

## 2024-08-08 NOTE — Telephone Encounter (Signed)
 Left voicemail on pts phone as said on DPR

## 2024-08-16 ENCOUNTER — Encounter: Admitting: Urgent Care

## 2024-10-18 ENCOUNTER — Ambulatory Visit: Payer: Self-pay

## 2024-10-18 NOTE — Telephone Encounter (Signed)
 FYI Only or Action Required?: Action required by provider: request for appointment and update on patient condition. Recommended UC or mobile bus in interim  Patient was last seen in primary care on 07/19/2024 by Bevely Doffing, FNP.  Called Nurse Triage reporting Blood Sugar Problem.  Symptoms began a week ago.  Interventions attempted: Other: water.  Symptoms are: gradually worsening.  Triage Disposition: See Physician Within 24 Hours  Patient/caregiver understands and will follow disposition?: Yes Message from Selinda RAMAN sent at 10/18/2024  4:30 PM EST  Summary: Blurried vision, headache, sweat smells sweet   Reason for Triage:  The patient called in stating she has been off her Metformin  since the end of November because she lost her insurance. She just got her insurance back Tuesday but state she is experiencing blurred vison, headaches and her sweat smells sweet. She would like to speak with a nurse. I will transfer her to E2C2 NT.         Reason for Disposition  [1] Symptoms of high blood sugar (e.g., increased thirst, frequent urination, weight loss) AND [2] not able to test blood glucose  Answer Assessment - Initial Assessment Questions 1. BLOOD GLUCOSE: What is your blood glucose level?      323 today 2. ONSET: When did you check the blood glucose?     This week 3. USUAL RANGE: What is your glucose level usually? (e.g., usual fasting morning value, usual evening value)     90's 4. KETONES: Do you check for ketones (urine or blood test strips)? If Yes, ask: What does the test show now?      Not monitored 5. TYPE 1 or 2:  Do you know what type of diabetes you have?  (e.g., Type 1, Type 2, Gestational; doesn't know)      Type two 6. INSULIN : Do you take insulin ? What type of insulin (s) do you use? What is the mode of delivery? (syringe, pen; injection or pump)?     Insulin  discontinued 7. DIABETES PILLS: Do you take any pills for your diabetes? If Yes, ask:  Have you missed taking any pills recently?     Has been off of metformin  since November 8. OTHER SYMPTOMS: Do you have any symptoms? (e.g., fever, frequent urination, difficulty breathing, dizziness, weakness, vomiting)     Head ache  Protocols used: Diabetes - High Blood Sugar-A-AH

## 2024-11-07 ENCOUNTER — Ambulatory Visit
